# Patient Record
Sex: Male | Born: 1940 | Race: White | Hispanic: No | Smoking: Current every day smoker
Health system: Southern US, Community
[De-identification: ages and names within clinical notes are randomized; demographics above are authoritative.]

## PROBLEM LIST (undated history)

## (undated) DIAGNOSIS — M1990 Unspecified osteoarthritis, unspecified site: Secondary | ICD-10-CM

## (undated) DIAGNOSIS — E785 Hyperlipidemia, unspecified: Secondary | ICD-10-CM

## (undated) DIAGNOSIS — F329 Major depressive disorder, single episode, unspecified: Secondary | ICD-10-CM

## (undated) DIAGNOSIS — M62830 Muscle spasm of back: Secondary | ICD-10-CM

## (undated) DIAGNOSIS — M5417 Radiculopathy, lumbosacral region: Secondary | ICD-10-CM

## (undated) DIAGNOSIS — F32A Depression, unspecified: Secondary | ICD-10-CM

## (undated) DIAGNOSIS — M5136 Other intervertebral disc degeneration, lumbar region: Secondary | ICD-10-CM

## (undated) DIAGNOSIS — M47816 Spondylosis without myelopathy or radiculopathy, lumbar region: Secondary | ICD-10-CM

## (undated) DIAGNOSIS — I10 Essential (primary) hypertension: Secondary | ICD-10-CM

## (undated) DIAGNOSIS — I803 Phlebitis and thrombophlebitis of lower extremities, unspecified: Secondary | ICD-10-CM

## (undated) DIAGNOSIS — E559 Vitamin D deficiency, unspecified: Secondary | ICD-10-CM

## (undated) DIAGNOSIS — F419 Anxiety disorder, unspecified: Secondary | ICD-10-CM

## (undated) DIAGNOSIS — I2510 Atherosclerotic heart disease of native coronary artery without angina pectoris: Secondary | ICD-10-CM

## (undated) DIAGNOSIS — I219 Acute myocardial infarction, unspecified: Secondary | ICD-10-CM

## (undated) HISTORY — PX: TONSILLECTOMY: SUR1361

## (undated) HISTORY — PX: ESOPHAGOGASTRODUODENOSCOPY: SHX1529

## (undated) HISTORY — PX: CYST EXCISION: SHX5701

---

## 2011-06-28 ENCOUNTER — Observation Stay: Payer: Self-pay | Admitting: Surgery

## 2011-06-28 LAB — CBC WITH DIFFERENTIAL/PLATELET
Basophil #: 0 10*3/uL (ref 0.0–0.1)
Basophil %: 0.3 %
Eosinophil #: 0.6 10*3/uL (ref 0.0–0.7)
Eosinophil %: 5 %
HCT: 37.1 % — ABNORMAL LOW (ref 40.0–52.0)
HGB: 12.4 g/dL — ABNORMAL LOW (ref 13.0–18.0)
Lymphocyte #: 1.6 10*3/uL (ref 1.0–3.6)
MCH: 30.7 pg (ref 26.0–34.0)
MCHC: 33.3 g/dL (ref 32.0–36.0)
Monocyte %: 7.9 %
Neutrophil #: 8.3 10*3/uL — ABNORMAL HIGH (ref 1.4–6.5)
Platelet: 244 10*3/uL (ref 150–440)
RDW: 13.7 % (ref 11.5–14.5)
WBC: 11.4 10*3/uL — ABNORMAL HIGH (ref 3.8–10.6)

## 2011-06-28 LAB — BASIC METABOLIC PANEL
Anion Gap: 9 (ref 7–16)
BUN: 28 mg/dL — ABNORMAL HIGH (ref 7–18)
Co2: 25 mmol/L (ref 21–32)
Creatinine: 1.25 mg/dL (ref 0.60–1.30)
Glucose: 122 mg/dL — ABNORMAL HIGH (ref 65–99)
Osmolality: 288 (ref 275–301)
Potassium: 4 mmol/L (ref 3.5–5.1)

## 2011-06-28 LAB — PROTIME-INR: INR: 0.8

## 2011-06-28 LAB — APTT: Activated PTT: 33.5 secs (ref 23.6–35.9)

## 2011-07-01 LAB — PATHOLOGY REPORT

## 2011-07-09 LAB — WOUND CULTURE

## 2012-03-08 DIAGNOSIS — I219 Acute myocardial infarction, unspecified: Secondary | ICD-10-CM

## 2012-03-08 HISTORY — PX: CORONARY ARTERY BYPASS GRAFT: SHX141

## 2012-03-08 HISTORY — DX: Acute myocardial infarction, unspecified: I21.9

## 2012-09-25 ENCOUNTER — Inpatient Hospital Stay: Payer: Self-pay

## 2012-09-25 LAB — TROPONIN I
Troponin-I: 0.7 ng/mL — ABNORMAL HIGH
Troponin-I: 5.7 ng/mL — ABNORMAL HIGH
Troponin-I: 9.5 ng/mL — ABNORMAL HIGH
Troponin-I: 17 ng/mL — ABNORMAL HIGH

## 2012-09-25 LAB — CBC WITH DIFFERENTIAL/PLATELET
Eosinophil #: 0.2 10*3/uL (ref 0.0–0.7)
HCT: 23.8 % — ABNORMAL LOW (ref 40.0–52.0)
Lymphocyte #: 2.1 10*3/uL (ref 1.0–3.6)
Lymphocyte %: 24.3 %
MCHC: 34.5 g/dL (ref 32.0–36.0)
Monocyte %: 9.2 %
Neutrophil #: 5.6 10*3/uL (ref 1.4–6.5)
RBC: 2.61 10*6/uL — ABNORMAL LOW (ref 4.40–5.90)
RDW: 14.5 % (ref 11.5–14.5)

## 2012-09-25 LAB — CK TOTAL AND CKMB (NOT AT ARMC)
CK, Total: 125 U/L (ref 35–232)
CK, Total: 565 U/L — ABNORMAL HIGH (ref 35–232)
CK-MB: 12.2 ng/mL — ABNORMAL HIGH (ref 0.5–3.6)
CK-MB: 88.3 ng/mL — ABNORMAL HIGH (ref 0.5–3.6)

## 2012-09-25 LAB — CBC
HGB: 7.4 g/dL — ABNORMAL LOW (ref 13.0–18.0)
MCHC: 35.3 g/dL (ref 32.0–36.0)
MCV: 92 fL (ref 80–100)
Platelet: 293 10*3/uL (ref 150–440)
RBC: 2.28 10*6/uL — ABNORMAL LOW (ref 4.40–5.90)
RDW: 14.7 % — ABNORMAL HIGH (ref 11.5–14.5)
WBC: 10.7 10*3/uL — ABNORMAL HIGH (ref 3.8–10.6)

## 2012-09-25 LAB — BASIC METABOLIC PANEL
Anion Gap: 8 (ref 7–16)
BUN: 49 mg/dL — ABNORMAL HIGH (ref 7–18)
Calcium, Total: 9.5 mg/dL (ref 8.5–10.1)
Chloride: 107 mmol/L (ref 98–107)
Glucose: 118 mg/dL — ABNORMAL HIGH (ref 65–99)
Potassium: 3.8 mmol/L (ref 3.5–5.1)
Sodium: 137 mmol/L (ref 136–145)

## 2012-09-25 LAB — HEMOGLOBIN: HGB: 9.6 g/dL — ABNORMAL LOW (ref 13.0–18.0)

## 2012-09-26 LAB — CBC WITH DIFFERENTIAL/PLATELET
Basophil %: 0.3 %
Eosinophil #: 0.2 10*3/uL (ref 0.0–0.7)
Eosinophil %: 2.4 %
Lymphocyte #: 1.6 10*3/uL (ref 1.0–3.6)
Lymphocyte %: 20.8 %
MCH: 31.9 pg (ref 26.0–34.0)
MCV: 90 fL (ref 80–100)
Monocyte #: 0.6 x10 3/mm (ref 0.2–1.0)
Monocyte %: 7.7 %
Neutrophil #: 5.3 10*3/uL (ref 1.4–6.5)
RBC: 3.15 10*6/uL — ABNORMAL LOW (ref 4.40–5.90)
RDW: 14.8 % — ABNORMAL HIGH (ref 11.5–14.5)
WBC: 7.7 10*3/uL (ref 3.8–10.6)

## 2012-09-26 LAB — BASIC METABOLIC PANEL
Calcium, Total: 8.1 mg/dL — ABNORMAL LOW (ref 8.5–10.1)
Chloride: 110 mmol/L — ABNORMAL HIGH (ref 98–107)
Co2: 25 mmol/L (ref 21–32)
Creatinine: 1.35 mg/dL — ABNORMAL HIGH (ref 0.60–1.30)
Glucose: 91 mg/dL (ref 65–99)
Osmolality: 283 (ref 275–301)
Sodium: 139 mmol/L (ref 136–145)

## 2012-09-26 LAB — PROTIME-INR
INR: 1
Prothrombin Time: 13.6 secs (ref 11.5–14.7)

## 2012-09-26 LAB — HEMOGLOBIN: HGB: 9.8 g/dL — ABNORMAL LOW (ref 13.0–18.0)

## 2012-10-02 DIAGNOSIS — Z951 Presence of aortocoronary bypass graft: Secondary | ICD-10-CM

## 2012-10-19 ENCOUNTER — Inpatient Hospital Stay: Payer: Self-pay | Admitting: Internal Medicine

## 2012-10-19 LAB — CBC
HCT: 37.7 % — ABNORMAL LOW (ref 40.0–52.0)
HGB: 13.1 g/dL (ref 13.0–18.0)
MCH: 30.6 pg (ref 26.0–34.0)
MCV: 88 fL (ref 80–100)
RBC: 4.28 10*6/uL — ABNORMAL LOW (ref 4.40–5.90)
RDW: 16.1 % — ABNORMAL HIGH (ref 11.5–14.5)

## 2012-10-19 LAB — DRUG SCREEN, URINE
Barbiturates, Ur Screen: NEGATIVE (ref ?–200)
Cocaine Metabolite,Ur ~~LOC~~: NEGATIVE (ref ?–300)
MDMA (Ecstasy)Ur Screen: POSITIVE (ref ?–500)
Phencyclidine (PCP) Ur S: NEGATIVE (ref ?–25)
Tricyclic, Ur Screen: NEGATIVE (ref ?–1000)

## 2012-10-19 LAB — ETHANOL
Ethanol %: <0.003 % (ref 0.000–0.080)
Ethanol: <3 mg/dL

## 2012-10-19 LAB — URINALYSIS, COMPLETE
Bilirubin,UR: NEGATIVE
Glucose,UR: NEGATIVE mg/dL (ref 0–75)
Hyaline Cast: 23
Ketone: NEGATIVE
Leukocyte Esterase: NEGATIVE
Nitrite: NEGATIVE
Protein: NEGATIVE
RBC,UR: 1 /HPF (ref 0–5)
Specific Gravity: 1.01 (ref 1.003–1.030)
WBC UR: 1 /HPF (ref 0–5)

## 2012-10-19 LAB — COMPREHENSIVE METABOLIC PANEL
Alkaline Phosphatase: 137 U/L — ABNORMAL HIGH (ref 50–136)
Anion Gap: 10 (ref 7–16)
BUN: 55 mg/dL — ABNORMAL HIGH (ref 7–18)
Bilirubin,Total: 0.4 mg/dL (ref 0.2–1.0)
Calcium, Total: 9.5 mg/dL (ref 8.5–10.1)
Chloride: 101 mmol/L (ref 98–107)
Co2: 20 mmol/L — ABNORMAL LOW (ref 21–32)
EGFR (Non-African Amer.): 18 — ABNORMAL LOW
Potassium: 5 mmol/L (ref 3.5–5.1)
SGOT(AST): 35 U/L (ref 15–37)
SGPT (ALT): 67 U/L (ref 12–78)
Total Protein: 8.1 g/dL (ref 6.4–8.2)

## 2012-10-19 LAB — ACETAMINOPHEN LEVEL: Acetaminophen: <2 ug/mL

## 2012-10-20 LAB — BASIC METABOLIC PANEL
Anion Gap: 7 (ref 7–16)
Calcium, Total: 8.6 mg/dL (ref 8.5–10.1)
Chloride: 105 mmol/L (ref 98–107)
Co2: 22 mmol/L (ref 21–32)
Creatinine: 2.51 mg/dL — ABNORMAL HIGH (ref 0.60–1.30)
EGFR (African American): 28 — ABNORMAL LOW

## 2012-10-20 LAB — CBC WITH DIFFERENTIAL/PLATELET
Basophil #: 0.1 10*3/uL (ref 0.0–0.1)
HCT: 34.1 % — ABNORMAL LOW (ref 40.0–52.0)
HGB: 11.9 g/dL — ABNORMAL LOW (ref 13.0–18.0)
Lymphocyte #: 2.2 10*3/uL (ref 1.0–3.6)
Lymphocyte %: 26.1 %
MCH: 30.8 pg (ref 26.0–34.0)
Monocyte #: 0.9 x10 3/mm (ref 0.2–1.0)
Monocyte %: 10.8 %
Neutrophil #: 4.6 10*3/uL (ref 1.4–6.5)
Neutrophil %: 54.6 %
Platelet: 402 10*3/uL (ref 150–440)
RDW: 15.8 % — ABNORMAL HIGH (ref 11.5–14.5)
WBC: 8.4 10*3/uL (ref 3.8–10.6)

## 2012-10-20 LAB — MAGNESIUM: Magnesium: 2.3 mg/dL

## 2012-10-21 LAB — BASIC METABOLIC PANEL
Anion Gap: 7 (ref 7–16)
BUN: 42 mg/dL — ABNORMAL HIGH (ref 7–18)
Calcium, Total: 8.5 mg/dL (ref 8.5–10.1)
Chloride: 109 mmol/L — ABNORMAL HIGH (ref 98–107)
Co2: 22 mmol/L (ref 21–32)
Creatinine: 2.06 mg/dL — ABNORMAL HIGH (ref 0.60–1.30)
EGFR (African American): 36 — ABNORMAL LOW
EGFR (Non-African Amer.): 31 — ABNORMAL LOW
Glucose: 109 mg/dL — ABNORMAL HIGH (ref 65–99)
Osmolality: 287 (ref 275–301)
Potassium: 4.5 mmol/L (ref 3.5–5.1)
Sodium: 138 mmol/L (ref 136–145)

## 2012-10-22 LAB — BASIC METABOLIC PANEL
Anion Gap: 6 — ABNORMAL LOW (ref 7–16)
Calcium, Total: 8.3 mg/dL — ABNORMAL LOW (ref 8.5–10.1)
Co2: 22 mmol/L (ref 21–32)
EGFR (African American): 52 — ABNORMAL LOW
EGFR (Non-African Amer.): 45 — ABNORMAL LOW
Glucose: 95 mg/dL (ref 65–99)
Osmolality: 288 (ref 275–301)
Potassium: 4.3 mmol/L (ref 3.5–5.1)
Sodium: 141 mmol/L (ref 136–145)

## 2013-01-05 ENCOUNTER — Ambulatory Visit: Payer: Self-pay | Admitting: Podiatry

## 2013-03-19 ENCOUNTER — Emergency Department: Payer: Self-pay | Admitting: Emergency Medicine

## 2013-03-19 LAB — URINALYSIS, COMPLETE
BACTERIA: NONE SEEN
Bilirubin,UR: NEGATIVE
Blood: NEGATIVE
GLUCOSE, UR: NEGATIVE mg/dL (ref 0–75)
Ketone: NEGATIVE
Leukocyte Esterase: NEGATIVE
NITRITE: NEGATIVE
PH: 5 (ref 4.5–8.0)
Protein: NEGATIVE
RBC,UR: 1 /HPF (ref 0–5)
SPECIFIC GRAVITY: 1.012 (ref 1.003–1.030)
SQUAMOUS EPITHELIAL: NONE SEEN
WBC UR: <1 /HPF (ref 0–5)

## 2013-05-07 ENCOUNTER — Emergency Department: Payer: Self-pay | Admitting: Emergency Medicine

## 2013-05-07 LAB — COMPREHENSIVE METABOLIC PANEL
ALK PHOS: 106 U/L
AST: 16 U/L (ref 15–37)
Albumin: 3.6 g/dL (ref 3.4–5.0)
Anion Gap: 5 — ABNORMAL LOW (ref 7–16)
BILIRUBIN TOTAL: 0.2 mg/dL (ref 0.2–1.0)
BUN: 28 mg/dL — ABNORMAL HIGH (ref 7–18)
CALCIUM: 9.5 mg/dL (ref 8.5–10.1)
CO2: 24 mmol/L (ref 21–32)
CREATININE: 1.04 mg/dL (ref 0.60–1.30)
Chloride: 111 mmol/L — ABNORMAL HIGH (ref 98–107)
EGFR (Non-African Amer.): >60
GLUCOSE: 116 mg/dL — AB (ref 65–99)
Osmolality: 286 (ref 275–301)
Potassium: 4.1 mmol/L (ref 3.5–5.1)
SGPT (ALT): 30 U/L (ref 12–78)
Sodium: 140 mmol/L (ref 136–145)
TOTAL PROTEIN: 6.9 g/dL (ref 6.4–8.2)

## 2013-05-07 LAB — CBC WITH DIFFERENTIAL/PLATELET
Basophil #: 0 10*3/uL (ref 0.0–0.1)
Basophil %: 0.5 %
Eosinophil #: 0.3 10*3/uL (ref 0.0–0.7)
Eosinophil %: 4 %
HCT: 39.9 % — ABNORMAL LOW (ref 40.0–52.0)
HGB: 13 g/dL (ref 13.0–18.0)
LYMPHS ABS: 1.8 10*3/uL (ref 1.0–3.6)
LYMPHS PCT: 24.1 %
MCH: 30.7 pg (ref 26.0–34.0)
MCHC: 32.7 g/dL (ref 32.0–36.0)
MCV: 94 fL (ref 80–100)
MONO ABS: 0.7 x10 3/mm (ref 0.2–1.0)
MONOS PCT: 9.3 %
NEUTROS PCT: 62.1 %
Neutrophil #: 4.6 10*3/uL (ref 1.4–6.5)
PLATELETS: 218 10*3/uL (ref 150–440)
RBC: 4.25 10*6/uL — ABNORMAL LOW (ref 4.40–5.90)
RDW: 14.1 % (ref 11.5–14.5)
WBC: 7.4 10*3/uL (ref 3.8–10.6)

## 2013-12-30 IMAGING — CR DG CHEST 2V
1 series · 2 of 2 positions shown · non-contrast
Comparison: none

REASON FOR EXAM: depression - recent cardiac bypass
COMMENTS:

[Series 1: w chest pa · 0.14mm/px · 2 of 2 slices shown]
[im 1/2]
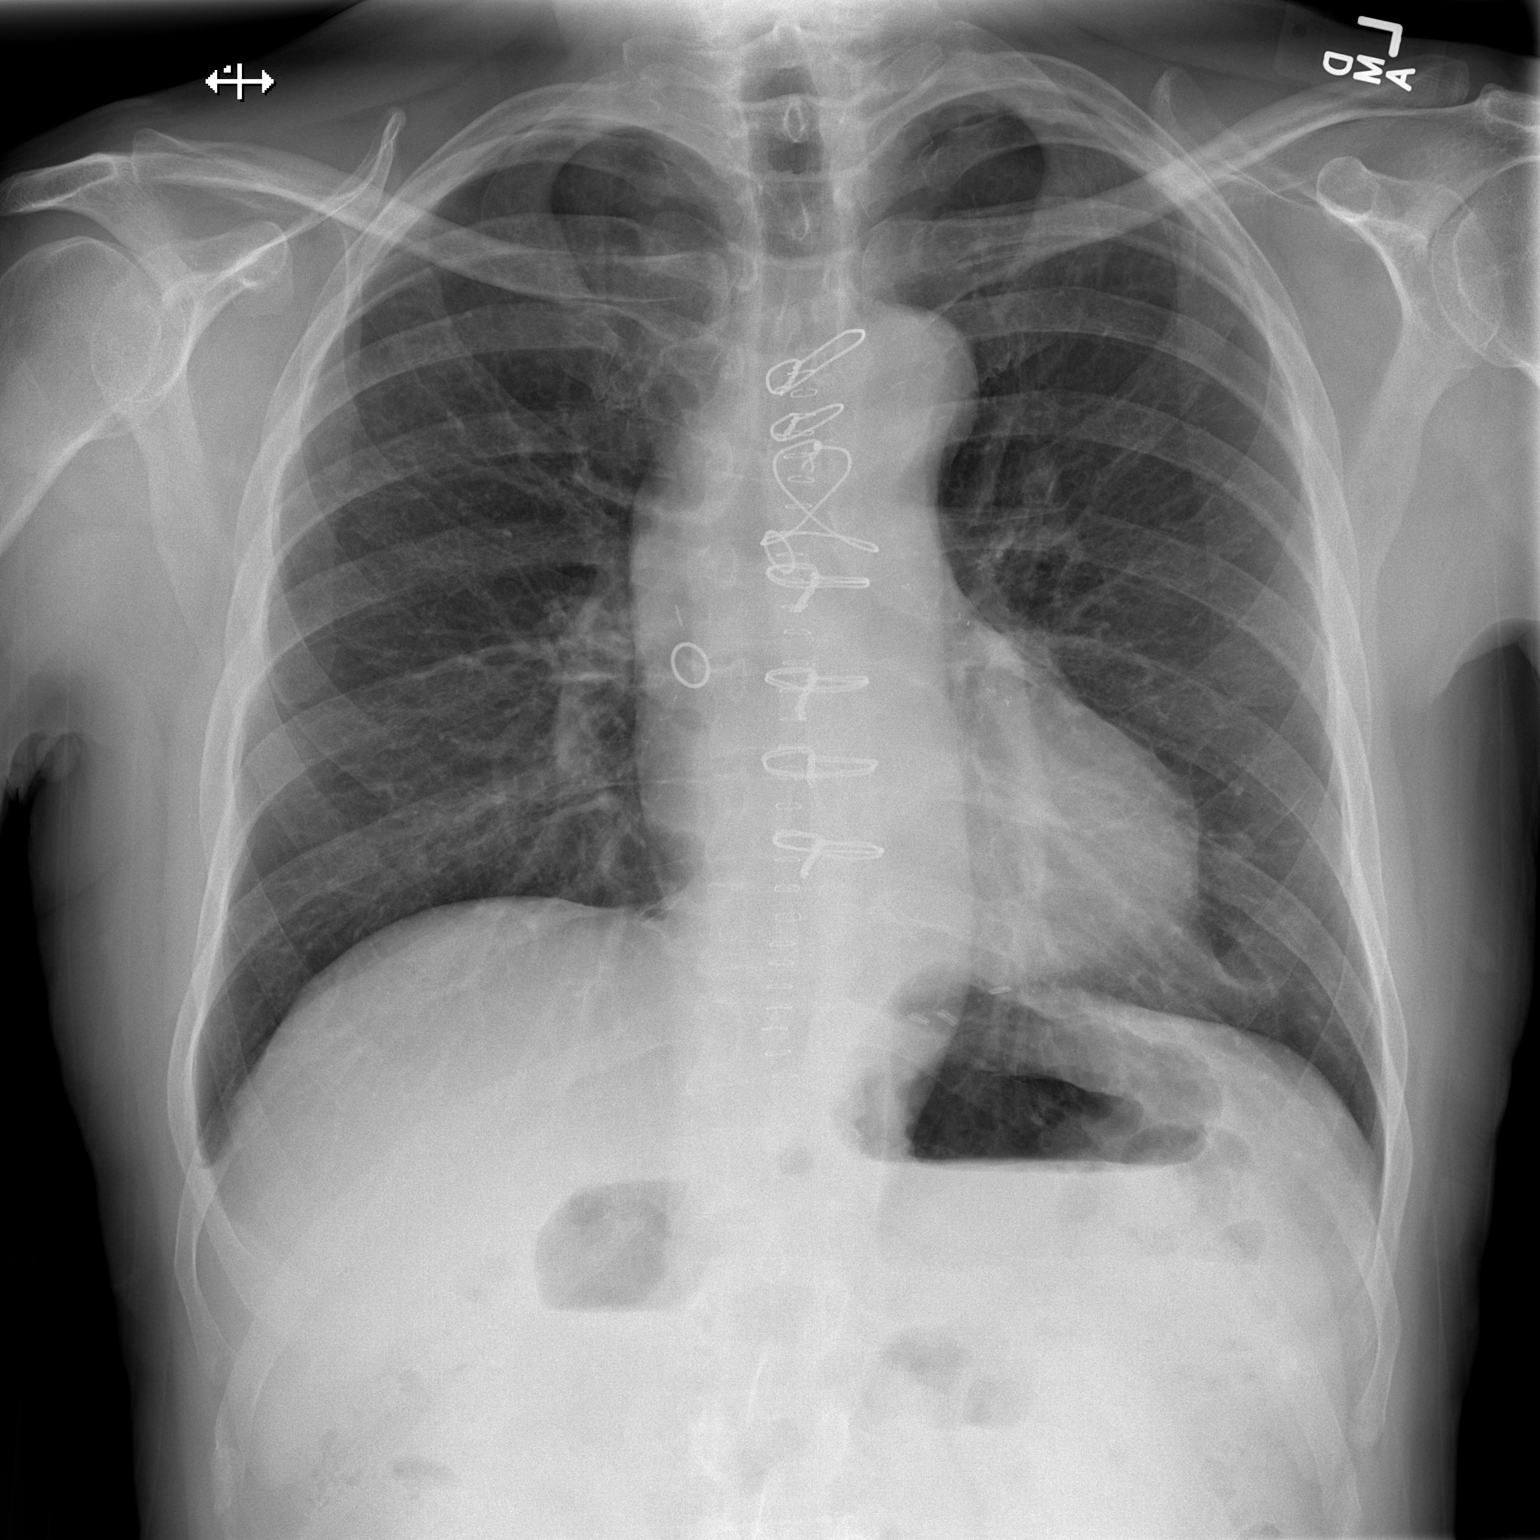
[im 2/2]
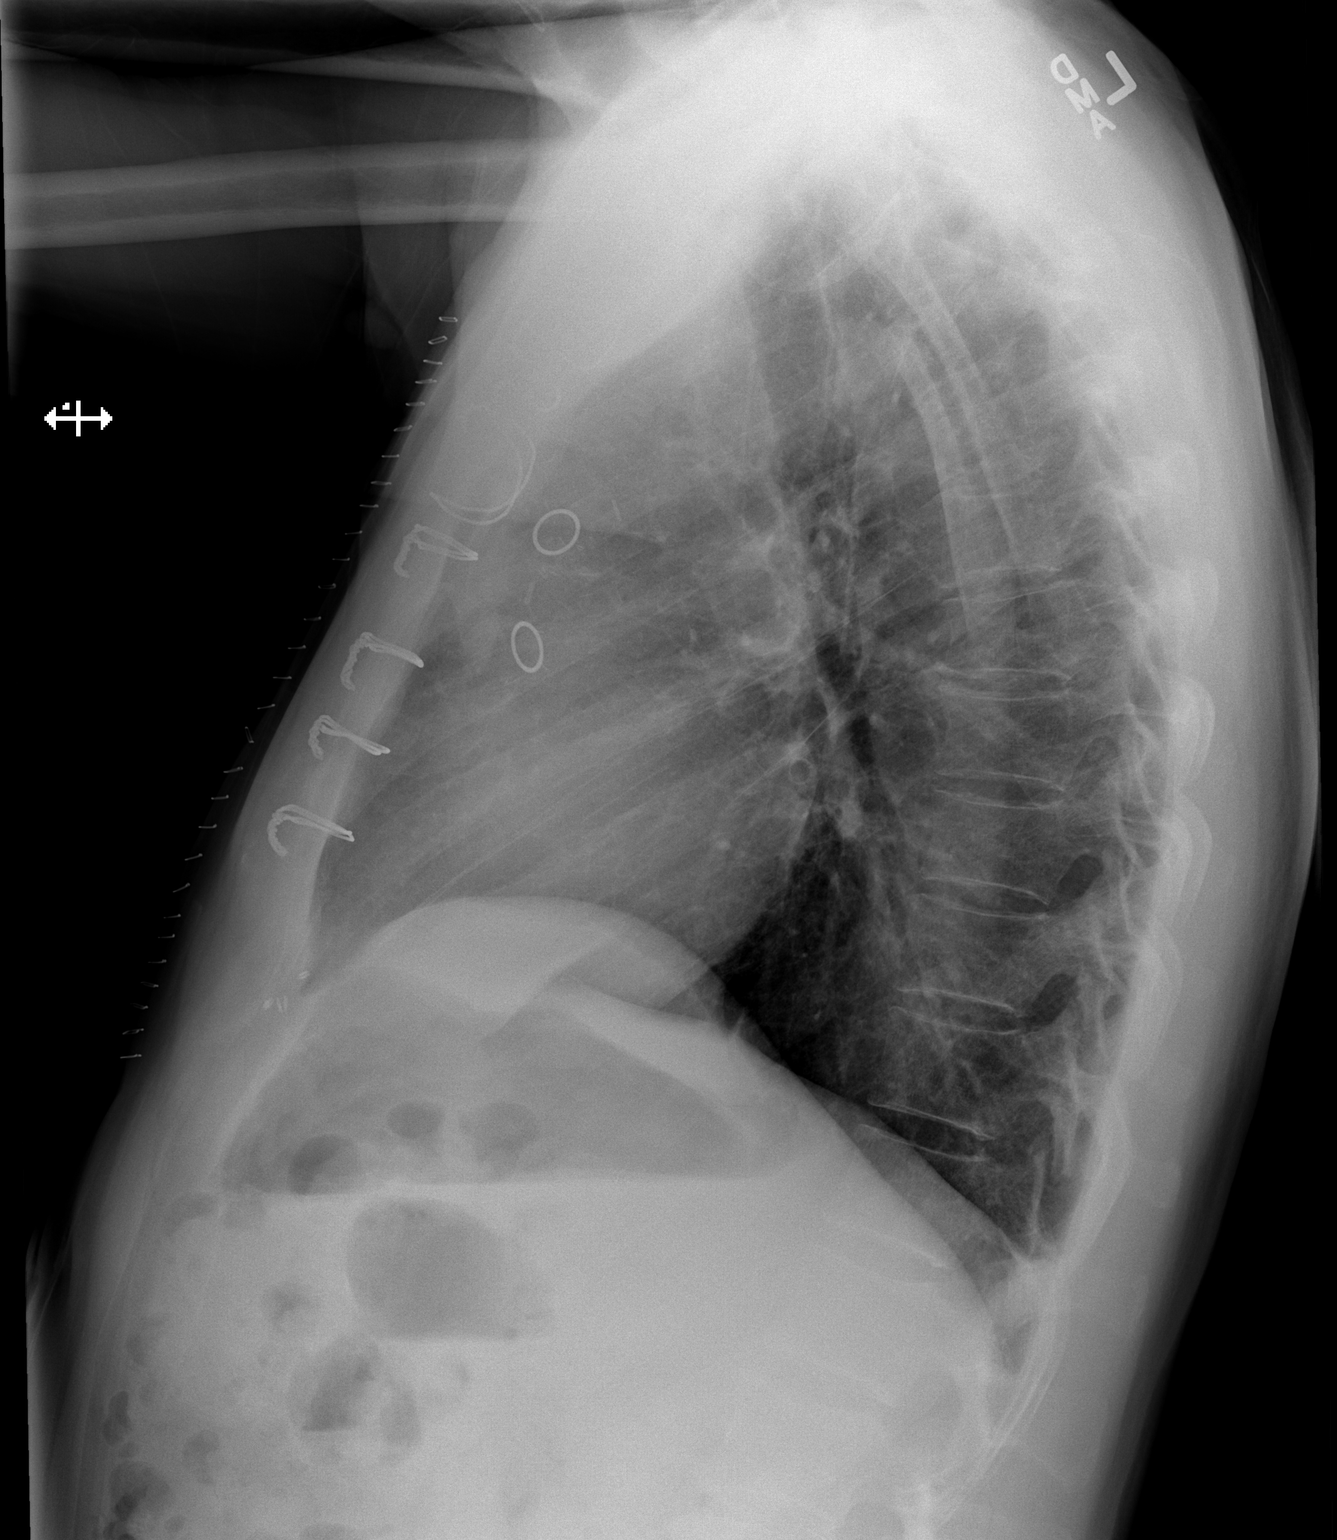

[2 of 2 positions shown; findings below may reference images not displayed]

PROCEDURE:     DXR - DXR CHEST PA (OR AP) AND LATERAL  - October 19, 2012  [DATE]

RESULT:     Comparison is made to the study September 25, 2012.

The lungs are well-expanded and clear. There is no infiltrate nor
atelectasis. The cardiac silhouette is normal in size. The pulmonary
vascularity is not engorged. The patient has undergone previous median
sternotomy and CABG. There is no pleural effusion.
IMPRESSION: There is no evidence of acute cardiopulmonary abnormality.

[REDACTED]

## 2013-12-31 IMAGING — US US RENAL KIDNEY
1 series · 14 of 25 positions shown · non-contrast
Comparison: none

REASON FOR EXAM: arf
COMMENTS:

[Series 1: us renal kidney · 0.28mm/px · 14 of 31 slices shown]
[im 1/31]
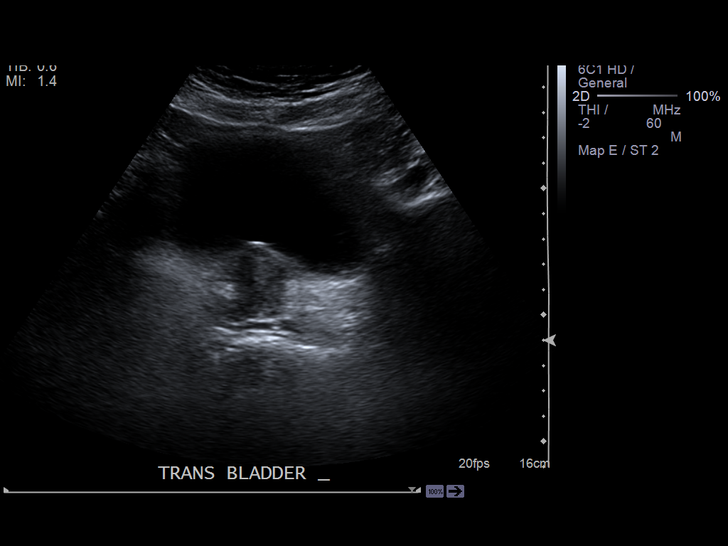
[im 3/31]
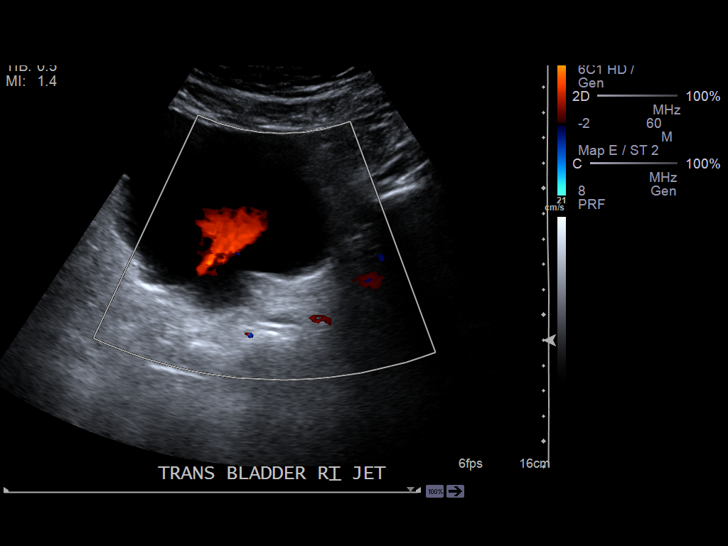
[im 6/31]
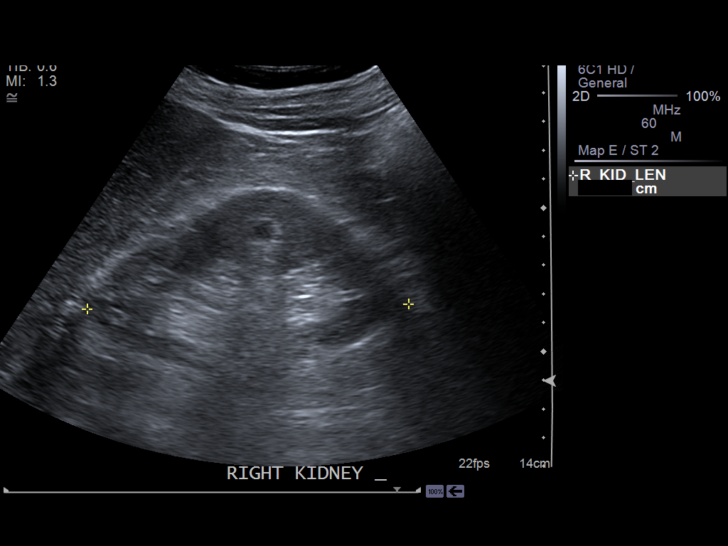
[im 8/31]
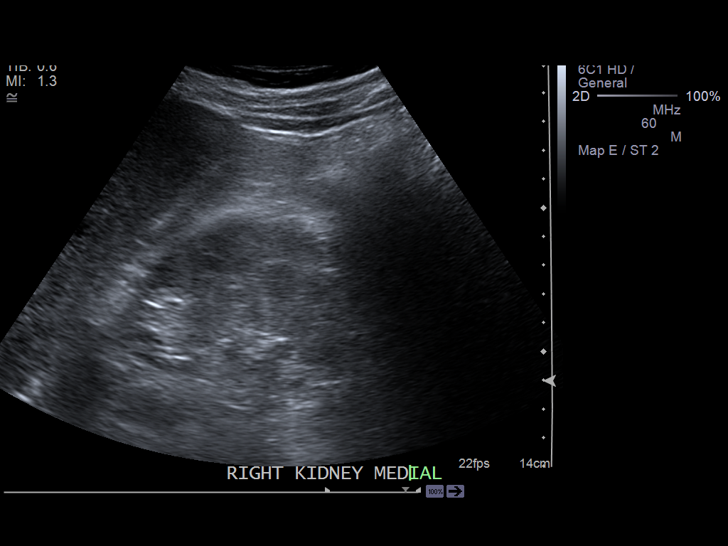
[im 11/31]
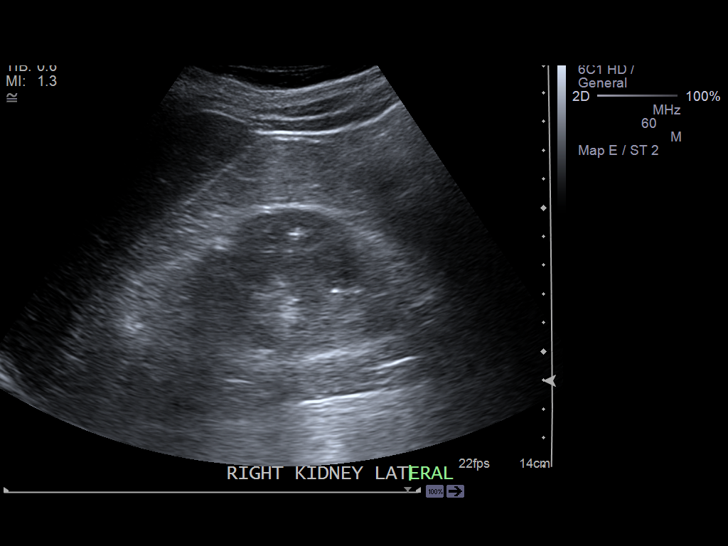
[im 12/31]
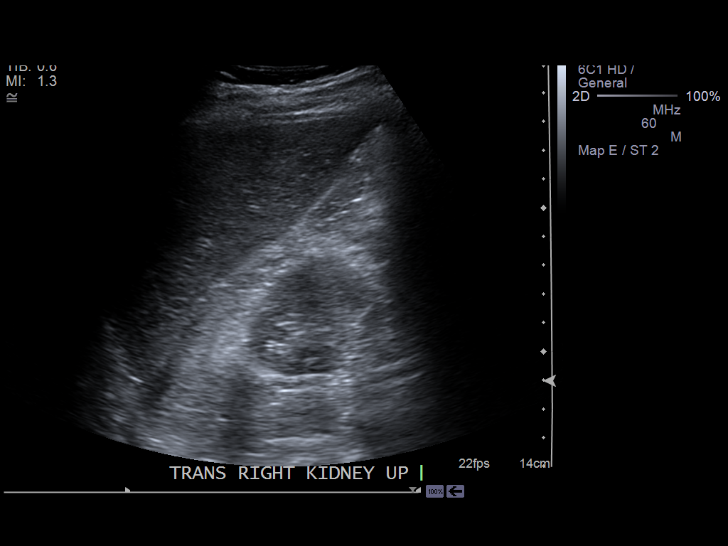
[im 14/31]
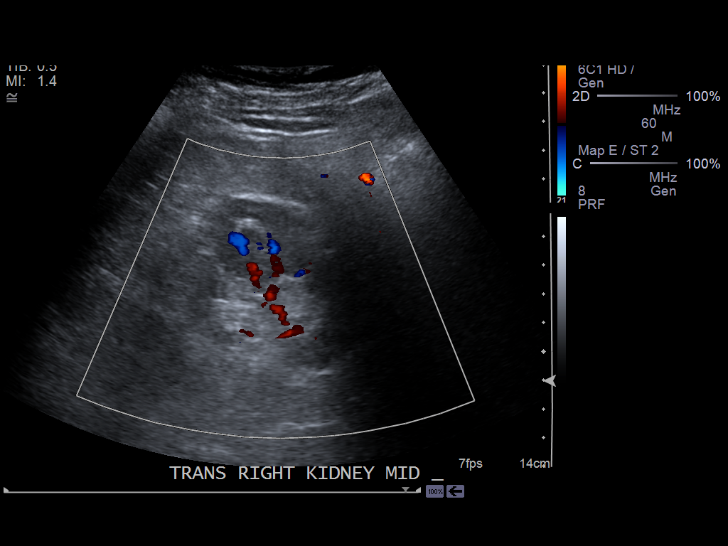
[im 17/31]
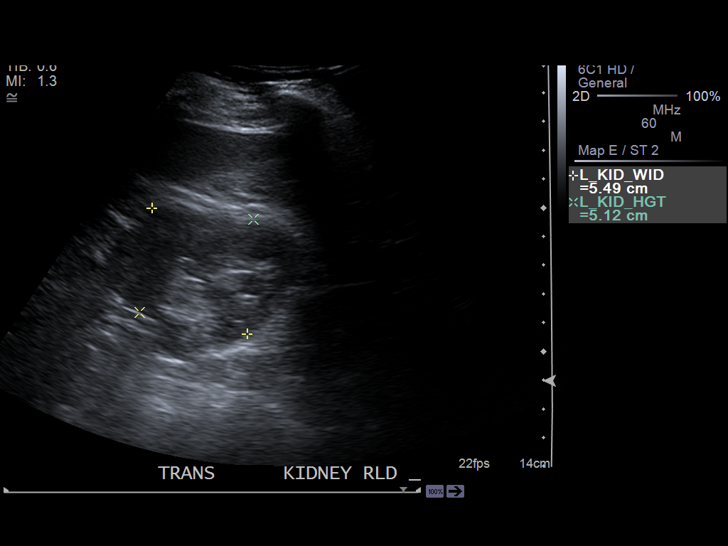
[im 19/31]
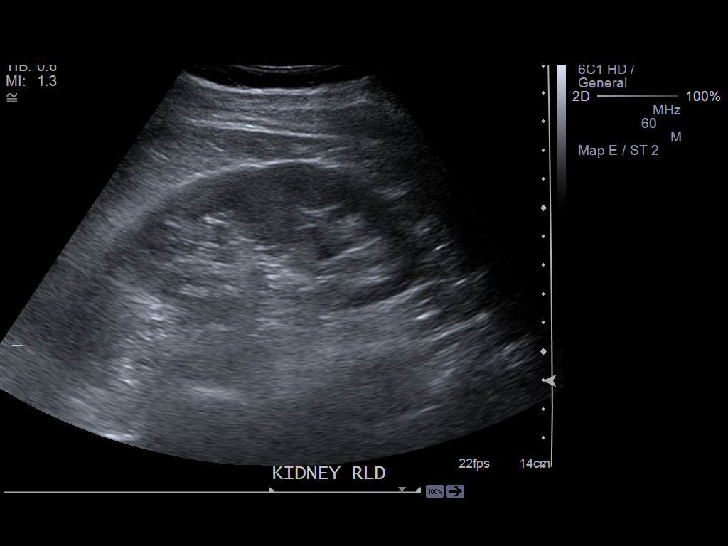
[im 21/31]
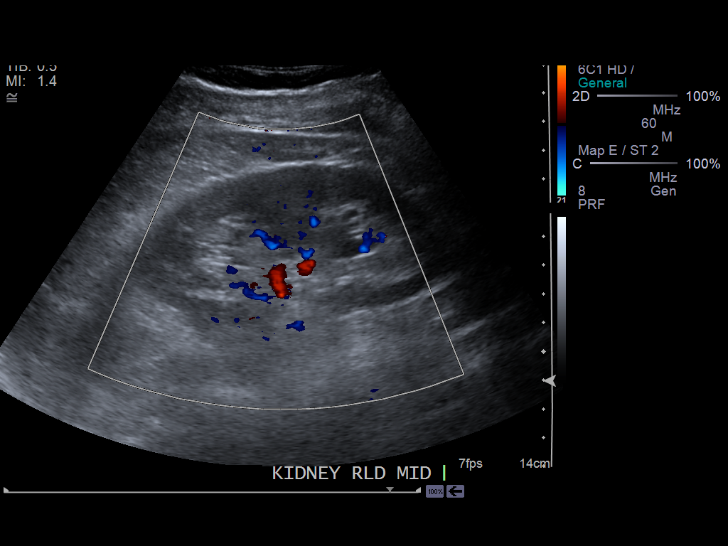
[im 23/31]
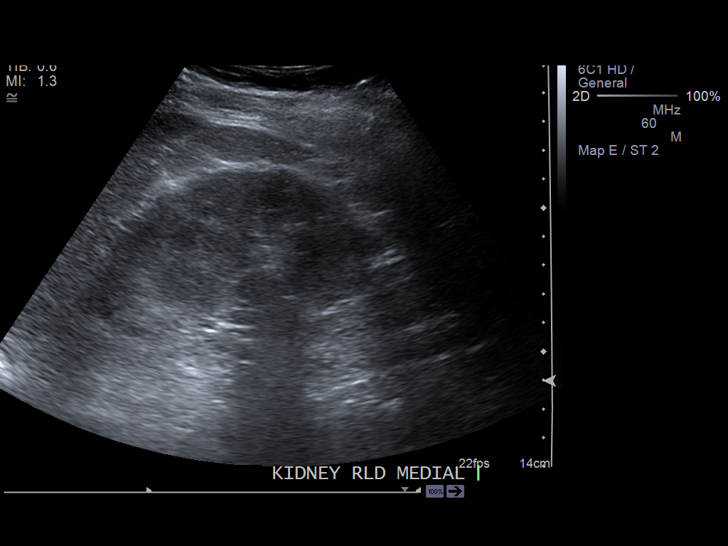
[im 26/31]
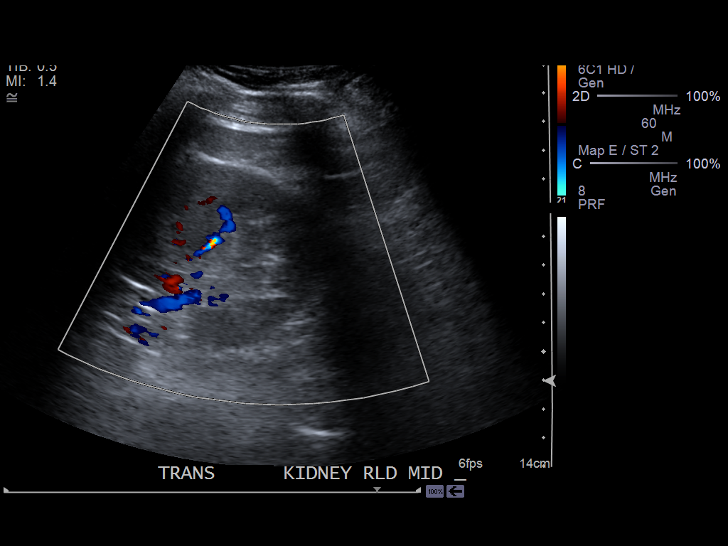
[im 28/31]
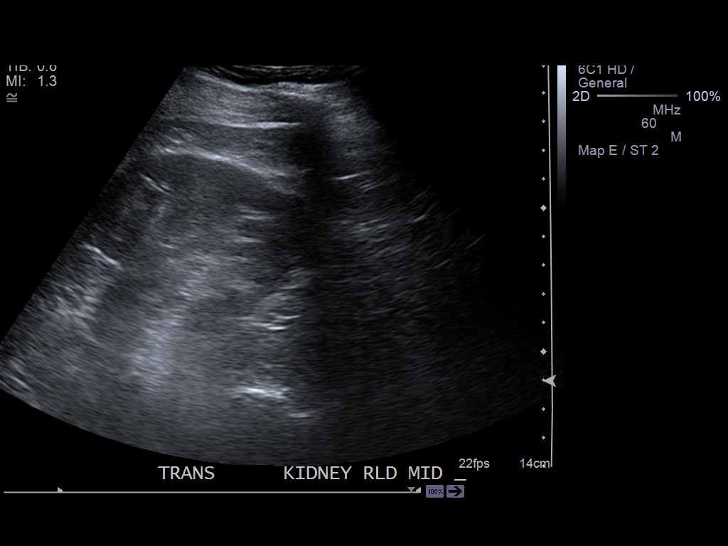
[im 31/31]
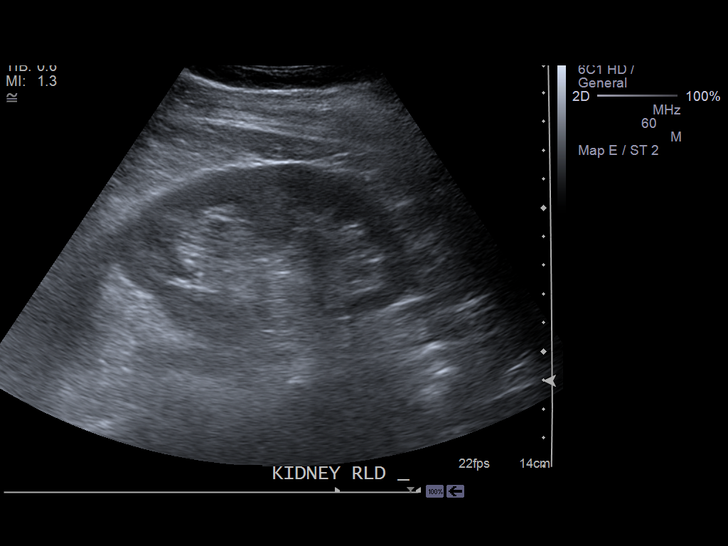

[14 of 25 positions shown; findings below may reference images not displayed]

PROCEDURE:     US  - US KIDNEY  - October 20, 2012 [DATE]

RESULT:     Comparison: None

Technique and findings: Multiple gray-scale and color doppler images of the
kidneys were obtained.

The right kidney measures 11.2 x 4.8 x 4 cm and the left kidney measures
11.5 x 5.5 x 5.1 cm. The kidneys are normal in echogenicity. There is no
hydronephrosis.  There are no echogenic foci.  There are no renal masses.
There is no free fluid in the region of the renal fossa. Bilateral ureteral
jets are noted.
IMPRESSION: Normal renal ultrasound.

[REDACTED]

## 2014-01-03 ENCOUNTER — Ambulatory Visit: Payer: Self-pay | Admitting: Physical Medicine and Rehabilitation

## 2014-01-17 ENCOUNTER — Encounter: Payer: Self-pay | Admitting: Physical Medicine and Rehabilitation

## 2014-02-05 ENCOUNTER — Encounter: Payer: Self-pay | Admitting: Physical Medicine and Rehabilitation

## 2014-06-28 NOTE — Consult Note (Signed)
Brief Consult Note: Diagnosis: Major depressive disorder.   Patient was seen by consultant.   Consult note dictated.   Recommend further assessment or treatment.   Orders entered.   Comments: [name] [name] has a h/o drepression. His medications were discontinued after CABG. He became depressed and passively suicidal.  PLAN: 1. The oatient is not suicidal or homicidal. Please discharge as appropriate.  2. Will restart all medications as prescribed by Dr. Bridgett Larsson Wellbutrin 75 mg bid, Remeron 60 mg at night, BuSpar 15 mg bid and Valium 5 mg as needed..  Electronic Signatures: Orson Slick (MD)  (Signed 15-Aug-14 17:24)  Authored: Brief Consult Note   Last Updated: 15-Aug-14 17:24 by Orson Slick (MD)

## 2014-06-28 NOTE — Consult Note (Signed)
PATIENT NAME:  [name] [name], [name] [name] MR#:  [id number] DATE OF BIRTH:  1940/08/20  DATE OF CONSULTATION:  09/25/2012  REFERRING PHYSICIAN:  Phillips Climes, MD CONSULTING PHYSICIAN:  Gaylyn Cheers, MD / Janalyn Harder. Jerelene Redden, ANP (Adult Nurse Practitioner)  PRIMARY CARE PHYSICIAN: Adrian Prows, MD  REASON FOR CONSULTATION:  GI bleed.  HISTORY OF PRESENT ILLNESS: This 74 year old patient with medical history of hypertension, depression and anxiety, and recent left anterior chest shingles who  was admitted through the Emergency Room for acute chest pain that started last night. The patient reports that he has had fatigue over the last week or 2, and on 09/22/2012 he noticed an abrupt worsening of fatigue associated with DOE and profound weakness and dizziness. The patient felt slightly better on Saturday. He noticed black, tarry stools the day before and it continued through yesterday. Brown bowel movement yesterday. He has developed chest pain last night, epigastric, radiating to the left anterior chest and into the back. He was diagnosed with left anterior shingles earlier this month and has been on hydrocodone and Valtrex. This chest pain last night was different and associated with lightheadedness, weakness and shortness of breath at rest, but no nausea, vomiting or diarrhea. He called 911 after several hour delay.   In the ER, he was found to have a drop in hemoglobin from baseline of 14 down to 7.5 and a slight elevation in troponin. Cardiologist consult has been requested. The patient is going to be admitted to the ICU. GI has been asked to see the patient regarding melena and abrupt drop in hemoglobin.   The patient states he was diagnosed with an ulcer in the late 60s. He did have an upper endoscopy in the 90s and he does not recall the results or why he had it. The patient typically does not have heartburn, except for maybe 2 to 3 times a month, and he will take Tums. The patient has placed himself on  aspirin 325 twice daily for many years. He reports normal diet, appetite and weight. With new diagnosis of shingles and left epicondylitis, he has been taking Advil 1800 mg for about 3 weeks and increased the dose to 2400 mg in the last 2 weeks. The patient also has utilized hydrocodone and stools were just a little constipated during that time frame. Currently, he has epigastric pain, burning in the chest, and this can fluctuate with position changes. He feels very short of breath, weak, and if he takes more than a few steps he feels dizzy. He has had no bowel movement yet today. He declined digital rectal exam because of prior anal abscess surgery. No history of colonoscopy.    PAST MEDICAL HISTORY: 1.  Hypertension.  2.  Depression and anxiety. Has been followed by Dr. Bridgett Larsson.  3.  Low vitamin D.  4.  Bilateral epicondylitis.  5.  Left anterior chest shingles diagnosed 09/12/2012.  6.  Remote peptic ulcer disease, maybe in the 1960s, and the patient reports EGD in 1990s, results unknown.   PAST SURGICAL HISTORY:  1.  Tonsillectomy as a child.  2.  Left breast biopsy in 1968.  3.  Right perianal abscess, treated with surgery, 06/28/2011.   MEDICATIONS:  1.  Aspirin 325 mg 1 tablet twice daily; the patient placed himself on this years ago.  2.  Bupropion 75 mg once daily.  3.  Bupropion 15 mg 1 tablet twice daily.  4.  Lisinopril 30 mg once daily.  5.  Mirtazapine 30 mg twice  daily.  6.  Valium 5 mg twice daily.  7.  Advil 2400 mg daily for 2 weeks.  8.  Completed Valtrex. 9.  Completed Norco for shingles.   ALLERGIES: No known drug allergies.   HABITS: Positive tobacco, 2 packs per day, now down to 1-1/2 packs per day. Rare social alcohol use, a few times a year. Denies heavy history of alcohol use.   FAMILY HISTORY: Father deceased at 22 of throat cancer with history of alcohol abuse. Mother deceased at 44 of suicide. The patient has half-brothers and half-sisters and their history is  unknown. To his knowledge, negative for colon cancer, colon polyps.   SOCIAL HISTORY: The patient is divorced, has a 48 year old son. Retired Dance movement psychotherapist.   REVIEW OF SYSTEMS: Ten systems reviewed. Positive pertinents as noted in history of present illness; otherwise, negative.   LABORATORY AND DIAGNOSTICS: Admission blood work with glucose 118, BUN 49, creatinine 1.52. CPK total 125. CPK/MB 12.2. Troponin 0.70.  Hemoglobin 7.4, WBC 10.7, platelet count 293, MCV 92, MCH 32.4.   Chest x-ray, single view:  Normal.   PHYSICAL EXAMINATION: VITAL SIGNS: 97.8, 77, 16, blood pressure 124/67 to 118/56. Normal sinus rhythm. EKG reportedly negative.  GENERAL:  The patient is well-appearing, sitting up in bed, alert and interacting. No acute distress.  HEENT: Head is normocephalic. Conjunctivae pink. Sclerae anicteric. Oral mucosa is dry and intact. Brown discoloration on the tongue.  NECK: Supple. Trachea midline.  HEART: Heart tones S1, S2 without murmur or gallop.  LUNGS: CTA. Respirations are eupneic.  ABDOMEN: Soft. Bowel sounds present. Nontender in all quadrants.  RECTAL: Previously declined by patient.  LOWER EXTREMITIES: Without edema, cyanosis or clubbing.  SKIN: Warm and dry without rash or excessive bruising.  MUSCULOSKELETAL: Joints without swelling, inflammation. Gait not evaluated.  NEUROLOGIC: Cranial nerves II through XII grossly intact.  PSYCH: Affect and mood within normal.  He has very pleasant, joking, INAD.  IMPRESSION: The patient reports chronic aspirin use and chronic Advil use, especially over the last 3 weeks. He has had minimal heartburn through the years, but does report a remote stomach ulcer 45 years ago and an EGD in 1990s. The patient's current symptoms began with weakness last week that became acute on Friday with dizziness, lightheadedness and black tarry stools. He passed black stools until yesterday. Yesterday stools reported brown. The patient likely has  NSAID-induced ulcer. This could be gastric or duodenal. He declined digital rectal exam. Hemoglobin is hemodynamically significant with a drop from baseline, about 14 down to 7 range.  Troponin is slightly elevated. EKG reportedly unremarkable. Cardiology consult pending.   PLAN: The patient is being admitted from the ER to the CCU.  We will place him n.p.o. for consideration of EGD this afternoon. The patient states he would like to meet Dr. Vira Agar before consenting to the procedure. He is on a Protonix drip. The patient was advised against taking high dose aspirin and NSAIDs. He voices understanding. I did recommend eventual colonoscopy for which he says he will likely decline. Further GI recommendations pending endoscopy  findings. Thank you for the consultation.   These services are provided by Joelene Millin A. Jerelene Redden, RN, MS, APRN, [facility], ANP under collaborative agreement with Manya Silvas, MD  ____________________________ Janalyn Harder Jerelene Redden, ANP (Adult Nurse Practitioner) kam:sb D: 09/25/2012 08:57:18 ET T: 09/25/2012 09:36:36 ET JOB#: [id number]  cc: Joelene Millin A. Jerelene Redden, ANP (Adult Nurse Practitioner), <Dictator> Cheral Marker. Ola Spurr, MD Janalyn Harder. Sherlyn Hay, MSN, ANP-BC Adult Nurse Practitioner ELECTRONICALLY  SIGNED 09/25/2012 17:11

## 2014-06-28 NOTE — H&P (Signed)
PATIENT NAME:  [name] [name], [name] [name] MR#:  [id number] DATE OF BIRTH:  11-01-1940  DATE OF ADMISSION:  10/19/2012  PRIMARY CARE PHYSICIAN:  Dr. Ola Spurr.   PRIMARY CARDIOLOGIST:  Dr. Ubaldo Glassing.    REFERRING PHYSICIAN:  Dr. Thomasene Lot.   CHIEF COMPLAINT:  The patient was brought in for suicidal thoughts and depression.   HISTORY OF PRESENT ILLNESS:  The patient is a pleasant 74 year old Caucasian male with a history of a recent non-ST elevation MI and upper GI bleed who got transferred to [facility] on July 22nd where he had a long hospitalization and just got discharged on the 5th to [facility]. While at [facility], the patient underwent a 3-vessel CABG, it appears, and had postop complications including cardiogenic shock, acute on chronic renal failure, paroxysmal a-fib with rapid ventricular rate on amnio and beta blocker. He has been at the rehab facility, which the patient has multiple complaints of including him not liking the food, not getting enough food, and the patient just felt fed up per his describing of the situation and has had about 14 pounds of weight loss there in the last 9 days or so. He states that although he did not have any suicidal ideation or wanting to hurt himself, he felt life is not worth anything if he was to remain in there and then he was transferred here. Of note, he was supposed to be there until the 26th or so. He still has staples intact. He has had no fevers or chills. Here in the ER, he was noted to have renal failure with a creatinine of 3.2, which is significantly higher than his baseline, with potassium of 5. Hospitalist service was contacted for further evaluation and management. He still feels depressed and states he has been having crying spells. He denies having a suicidal ideation.   PAST MEDICAL HISTORY:  1.  CAD, status post 3-vessel CABG on July 28th in [place].  2.  Hypertension.  3.  Hyperlipidemia.  4.  Paroxysmal a-fib, now in sinus.  5.  Anxiety. 6.  Upper GI  bleed, now resolved.  7.  Chronic renal failure with discharge creatinine of 1.7 from [facility].  8.  A history of vitamin D deficiency. 9.  Depression.   ALLERGIES:  No known drug allergies.   SOCIAL HISTORY:  He was a smoker prior to his CABG, now he is on a patch. No alcohol or drug use. He used to live by himself and states he used to walk for a couple of miles daily.   FAMILY HISTORY:  Father had throat cancer and alcohol abuse. Mom had suicide.   OUTPATIENT MEDICATIONS:  Amiodarone 200 mg daily, aspirin 81 mg daily, atorvastatin 80 mg daily, bupropion 75 mg 2 times a day, buspirone 10 mg 2 times a day, diazepam 2.5 mg every 12 hours as needed for anxiety or sleep, ferrous sulfate 325 mg 2 times a day, Lasix 40 mg 2 times a day, Klor-Con 20 mEq 4 tabs once a day, metoprolol tartrate 12.5 mg every 12 hours, mirtazapine 15 mg at bedtime. This dose was decreased from his usual 60 mg at bedtime before surgery. Nicotine patch 21 mg daily one patch, oxycodone 5 mg 1 to 2 tabs every 6 hours as needed for pain, Promethazine 25 mg every 6 hours as needed for nausea, Senna 8.6 mg 2 tabs 2 times a day for constipation, Bactrim 800/160 dose.  REVIEW OF SYSTEMS:  CONSTITUTIONAL:  Positive for with a total weight loss of 20  pounds postop. No fevers or chills.  EYES:  No blurry vision or double vision. No glaucoma or cataracts.  EARS, NOSE, THROAT:  No tinnitus or hearing loss.  RESPIRATORY:  No cough, shortness of breath, wheezing, hemoptysis, dyspnea or painful respirations.  CARDIOVASCULAR:  No chest pain. No dyspnea on exertion. No heartburn. Has a history of postop paroxysmal a-fib. No palpitations or syncope.  GASTROINTESTINAL:  No nausea, vomiting, diarrhea, abdominal pain, black stools or and tarry stools. A recent upper GI bleed.  GENITOURINARY:  Denies dysuria or hematuria.  HEMATOLOGIC AND LYMPHATIC:  No anemia or easy bruising.  SKIN:  No rashes.  MUSCULOSKELETAL:  Denies arthritis or gout.   NEUROLOGIC:  Denies focal weakness or numbness. Has left upper shoulder area post-herpetic neuralgia.  PSYCHIATRIC:  Has chronic depression and more so recently.    PHYSICAL EXAMINATION:  VITAL SIGNS:  Temperature on arrival 98.3, pulse rate 74, respiratory rate 22, blood pressure 115/72, O2 sat was 96% on room air.  GENERAL:  The patient is a thin, tall, Caucasian male, sitting at the edge of the bed, talking in full sentences with normal affect, no obvious distress.  HEENT:  Normocephalic, atraumatic. Pupils are equal and reactive. Extraocular muscles intact. Dry mucous membranes.  NECK:  Supple. No thyroid tenderness. No cervical lymphadenopathy.  CARDIOVASCULAR:  S1, S2, regular rate and rhythm. No murmurs, rubs or gallops.  LUNGS:  Clear to auscultation without wheezing, rhonchi or rales. There is a large mid sternal healing wounds with staples intact without any significant drainage or evidence for cellulitis.  ABDOMEN:  Soft, nontender, nondistended. Positive bowel sounds in all quadrants.  There is 3 areas of suture placements with intact sutures in mid abdominal area. No surrounding cellulitis.  EXTREMITIES:  No significant lower extremity edema.  NEUROLOGICAL:  Cranial nerves II through XII grossly intact. Strength is 5/5 in all extremities. Sensation is intact to light touch.  PSYCHIATRIC:  Awake, alert, oriented, pleasant, cooperative.   LABS:  WBC 10.9, hemoglobin 13.1, platelets 528. UA not suggestive of infection. Serum acetaminophen salicylates not elevated. Urine drug screen positive for benzodiazepines and MDMA is positive. TSH is 5.01. LFTs showed alk phos of 137, otherwise within normal limits. BUN is 55, creatinine is 3.2, sodium 131, potassium is 5.   X-ray of the chest is ordered, not done yet.   EKG:  Normal sinus rhythm with sinus arrhythmia. There are Q-waves in V5, V6, and some T-wave inversions in those leads, and there are some ST elevation in lead 2.    ASSESSMENT AND PLAN:  We have a pleasant 74 year old Caucasian male who was transferred to [facility] on July 22nd for 3-vessel coronary artery disease requiring coronary artery bypass graft, but also upper GI bleed, who had a prolonged hospitalization at [facility], who comes in for worsening depression and acute on chronic renal failure secondary to poor p.o. intake and him not eating well, likely also being on some medications including Lasix and Bactrim, which could potentially worsen the kidney function. He has no chest pains, no palpitations and is doing well from cardiac standpoint, he states. At this point, we will admit him to the hospital service and start him on some gentle fluids, hold the Bactrim and the Lasix. He has no suicidal ideation per him and he states that he loves life and does not want to hurt himself and he did not want to undergo open heart surgery if he wanted to hurt himself now. We would, nonetheless, start the patient  on a higher dose of mirtazapine at this point as before he was on 60 mg and now on 15. We would obtain a Psych consult and place a sitter in the room. He does have mild hyponatremia, and this should hopefully get better with normal saline and this could be hypovolemic hyponatremia now. In regards to his recent upper gastrointestinal bleed, his hemoglobin has improved. We would continue the iron. We would continue the beta blocker and amiodarone for his paroxysmal atrial fibrillation. Of note, he is not on anticoagulation, likely in the setting of a recent gastrointestinal bleed. We would let Psychiatry deal with the other depression medications. We would hold the Klor-Con given the renal failure and continued the nicotine patch and his pain medications.   CODE STATUS:  THE PATIENT IS FULL CODE.   TOTAL TIME SPENT IS:  60 minutes.  ____________________________ Vivien Presto, MD sa:jm D: 10/19/2012 15:26:33 ET T: 10/19/2012 16:21:00 ET JOB#: [id number]  cc: Vivien Presto, MD, <Dictator> Cheral Marker. Ola Spurr, MD Javier Docker Ubaldo Glassing, MD Vivien Presto MD ELECTRONICALLY SIGNED 10/29/2012 12:37

## 2014-06-28 NOTE — Consult Note (Signed)
CC: MI, GI bleed, pt with hgb 9.8, troponin up to 17.  His cath was done, needs to be transfered to [facility].  I explained to him that he likely had bleeding ulcers that have stopped and now have clots on them which need to mature to decrease the chance of repeat bleeding and that takes about 72 hours which is why he is on a restricted diet for now.    Electronic Signatures: Manya Silvas (MD)  (Signed on 22-Jul-14 15:05)  Authored  Last Updated: 22-Jul-14 15:05 by Manya Silvas (MD)

## 2014-06-28 NOTE — Consult Note (Signed)
CC:: LGI bleeding with melena.  Likely from UGI source.  Last two stools more brown than black.  Lots of NSAID recently due to back pain and neuralgia from Shingles.  Pt was anemic on admission, developed elevated troponin over 5 and EKG changes c/w ischemic heart disease.  Cath planned today per CCU nurse.  Will hold EGD plans for now, scope only for life threatening bleeding. Continue bid PPI or drip, sips and chips for now, advance to no carbonation clear liquid after cath.  Will follow with you, monitor daily CBC for next few days.  Electronic Signatures: Manya Silvas (MD)  (Signed on 21-Jul-14 14:19)  Authored  Last Updated: 21-Jul-14 14:19 by Manya Silvas (MD)

## 2014-06-28 NOTE — H&P (Signed)
PATIENT NAME:  [name] [name] MR#:  [id number] DATE OF BIRTH:  [birth year]  DATE OF ADMISSION:  09/25/2012  REFERRING PHYSICIAN:  Dr. Conni Slipper.  PRIMARY CARE PHYSICIAN: Dr. Adrian Prows.    CHIEF COMPLAINT: Atypical chest pain.   HISTORY OF PRESENT ILLNESS: This is a 74 year old male with a significant past medical history of hypertension, hyperlipidemia, vitamin D deficiency, depression who presents with complaint of atypical chest pain. Reports he has chest pain that started his evening at first. Reports his pain was worsening when he was laying supine. Reports his feet feel like heartburn and indigestion going through his chest. Reports this has been in quality nonradiating. His other complaint was of some mild shortness of breath. As well, he reports he has some dizziness and lightheadedness, worsened by deep inspiration and exertion.   The patient refused rectal exam by ED physician. As well he refused a rectal exam by me so he was instructed to give [place] a stool sample at soon as he has a bowel movement to have Hemoccult done.  In the ED, the patient was found to have elevated troponin at 0.7. His EKG had some abnormalities in the anterolateral leads, but they were stable as compared to the old EKG at [facility] in 2012.   As well, the patient was found to have a hemoglobin of 7.4. The patient reports that he has been having melena for the last few 3 days, dark and tarry stools. As well, he reports he has been on aspirin 325 mg 2 times a day for a long time. As well he reports for the last week he has using large doses of ciprofloxacin for shingles, were he was taking large doses on a daily basis  of 2400 mg oral Vicoprofen for his shingles. As well, he reports some dizziness and lightheadedness. The patient denies any previous history of EGD or colonoscopy in the past. Denies any history of gastritis or reflux, or gastric ulcer in the past.   PAST MEDICAL HISTORY: 1.  Depression.   2.  Hypertension.  3.  Vitamin D deficiency.   ALLERGIES: No known drug allergies.   HOME MEDICATIONS: 1.  Aspirin 325 mg p.o. b.i.d.  2.  Lisinopril 30 mg oral daily.  3.  Valium 5 mg oral 2 times a day, as needed.  4.  Mirtazapine 60 mg oral daily.  5.  Buspirone 15 mg oral 2 times a day.  6.  Bupropion 75 mg oral daily.   SOCIAL HISTORY: The patient reports his smoking, but he did cut back from 2 to 1 pack a day, thus he is using bupropion. Denies any alcohol or illicit drug use.   Father died of throat cancer, with a history of alcohol abuse. Mother died at the age of 40 of suicide.   REVIEW OF SYSTEMS: CONSTITUTIONAL: The patient denies fever, chills, fatigue, weight gain, weight loss.  EYES: Denies blurry vision, double vision, inflammation, glaucoma.  ENT: Denies tinnitus, ear pain, hearing loss.  RESPIRATORY: Denies any cough, wheezing, hemoptysis, dyspnea, painful respirations, COPD.  CARDIOVASCULAR: Complains of chest pain, more like heartburn, worsening when laying supine, relieved standing up. Denies any edema, arrhythmia, palpitations, syncope.  GASTROINTESTINAL: Denies nausea, vomiting, diarrhea, abdominal pain, hematemesis. Reports melena.  GENITOURINARY: Denies hematuria, dysuria, hematuria, renal colic.  ENDOCRINE: Denies polyuria, polydipsia, heat or cold intolerance.  HEMATOLOGY: Denies anemia, easy bruising, bleeding diathesis.  INTEGUMENTARY: Denies acne, rash or skin lesions.  MUSCULOSKELETAL: Denies any gout, cramps. Has right elbow pain.  NEUROLOGICAL: Complains of lightheadedness and dizziness, but denies any CVA, TIA, dementia, headache, dysarthria.  PSYCHIATRIC: Has history of anxiety, depression. Denies any schizophrenia, substance or alcohol abuse.   PHYSICAL EXAMINATION: VITAL SIGNS: Temperature 97.8, pulse 77, respiratory rate 16, blood pressure 115/56, breathing 100% on room air.  GENERAL: Elderly male who looks comfortable, in no apparent distress.   HEENT: Head atraumatic, normocephalic. Pupils equal and reactive to light. Pink conjunctivae. Anicteric sclerae. Moist oral mucosa.  NECK: Supple. No thyromegaly. No JVD.  CHEST: Good air entry bilaterally. No wheezing, rales or rhonchi.  ABDOMEN: No tenderness to palpation.  CARDIOVASCULAR: S1, S2 heard. No rubs, murmurs, gallops.  ABDOMEN: Soft, nontender, nondistended. Bowel sounds present.  EXTREMITIES: No edema. No clubbing. No cyanosis. Dorsalis pedis pulses +2 bilaterally.  SKIN: Has shingle rash that is dried and crusted. No vesicular rash in the left chest area.  PSYCHIATRIC: Appropriate affect. Awake, alert x 3. Intact judgment and normal insight.  NEUROLOGIC: Cranial nerves grossly intact. Motor 5/5.  LYMPHATICS: No cervical or axillary, or supraclavicular lymphadenopathy.   PERTINENT LABS: Glucose 118, BUN 49, creatinine 1.52, sodium 137, potassium  3.8, chloride 107, CO2 22.   Troponin 0.70. CK-MB 12.2. Total CK 125, white blood cells 10.7, hemoglobin 7.4, hematocrit 20.9, platelets 293.   ASSESSMENT AND PLAN: 1.  Chest pain: Appears to be atypical, more heartburn-quality. The patient will be admitted. Will continue to cycle his cardiac enzymes.  2.  Elevated troponin: This is most likely due to demand ischemia from his anemia. Will continue to cycle patient's cardiac enzymes and will follow the trend. Coumadin was discussed with cardiology who will see the patient today, and certainly no anticoagulation at this point secondary to his GI bleed.  3.  Anemia, due to gastrointestinal bleed. This is most likely upper gastrointestinal bleed from non-steroidal anti-inflammatory medication use as the patient is using large doses of ibuprofen and aspirin on his own without any physician instructions, but he was doing due to his shingles pain and his elbow pain. At this point, the patient will be kept n.p.o. except for meds and some sips of water. Will be started on Protonix drip. Will  continue to monitor his hemoglobin every  8 hours. Will transfuse him 2 units packed red blood cells. Patient with elevated troponins. Discussed with GI. The patient will most likely have an EGD in the a.m.  4.  Tobacco use: The patient was counseled for 5 minutes. Will start on nicotine patch.  5.  Anxiety and depression: Will continue his home meds.  6.  Hypertension: Will hold meds until he is more stable.  7.  Acute renal failure: Is most likely due to volume depletion from his GI bleed and anemia. Will continue with fluid hydration and replace with volume as blood.  8.  Deep vein thrombosis prophylaxis: Certainly no chemical anticoagulation. Will continue with DVT prophylaxis as sequential compression device.  9.  GI prophylaxis: The patient is on a Protonix drip.  10.  CODE STATUS: THE PATIENT IS A FULL CODE.   Total time spent on admission and patient care: 60 minutes.  patient was sighed off to [facility] service.  ____________________________ Albertine Patricia, MD dse:dm D: 09/25/2012 08:10:26 ET T: 09/25/2012 08:52:58 ET JOB#: [id number]  cc: Albertine Patricia, MD, <Dictator> Zidane Renner Graciela Husbands MD ELECTRONICALLY SIGNED 09/25/2012 23:33

## 2014-06-28 NOTE — Consult Note (Signed)
   Present Illness 74 yo male with no prior cardiac history but history of hypertension and tobacco abuse who e3as admitted after presenting to the er with complaints of dark tarry stools. He was noted to have a hgb of 7.4. He had complaints of chest pain with a troponin of 0.7. He has shingles in the left chest dermatome which has been causing chest pain. He complains of chest pain that he is not sure is his shingles, gerd or something else. Initial ekg was non specific. He continues to have chest pain. He has been taking large amounts of ibuprophen and norco for his shingles.   Physical Exam:  GEN well nourished   HEENT PERRL, hearing intact to voice   NECK supple   RESP normal resp effort  clear BS   CARD Regular rate and rhythm  Normal, S1, S2  No murmur   ABD denies tenderness  normal BS   LYMPH negative neck, negative axillae   EXTR negative cyanosis/clubbing, negative edema   SKIN normal to palpation   NEURO cranial nerves intact, motor/sensory function intact   PSYCH A+O to time, place, person, anxious   Review of Systems:  Subjective/Chief Complaint mid sternal and left lateral chest pain   General: tarry stools and chest pain   Skin: Rashes  left chest shingles   ENT: No Complaints   Eyes: No Complaints   Neck: No Complaints   Respiratory: No Complaints   Cardiovascular: Chest pain or discomfort   Gastrointestinal: Black tarry stools   Genitourinary: No Complaints   Vascular: No Complaints   Musculoskeletal: No Complaints   Neurologic: No Complaints   Hematologic: No Complaints   Endocrine: No Complaints   Psychiatric: Anxiety   Review of Systems: All other systems were reviewed and found to be negative   Medications/Allergies Reviewed Medications/Allergies reviewed   EKG:  EKG NSR   Abnormal NSSTTW changes    No Known Allergies:    Impression 74 yo male with history of shingles and chest pain with no priro cardiac histgory who has  been taking alot of nsaids now with melena, anemia and chest pain with abnormal troponin at 0.7. Recieving blood but still with chest pain. Echo and more blood pending. Will repeat troponin. Not a candidate for urgent cath at present given gi bleeding limiting any intervention which would require antiplatelet therapy. Source of blood loss with help guide further cardiac therapy.   Plan 1. IV fluid bolus. 2. Transfuse as you are doing to get hgb greater than 10 3. Echo 4. Repeat troponin 5. Determine source of blood loss if possible 6. Further recs after echo and repeat troponin   Electronic Signatures: Teodoro Spray (MD)  (Signed 21-Jul-14 11:22)  Authored: General Aspect/Present Illness, History and Physical Exam, Review of System, EKG , Allergies, Impression/Plan   Last Updated: 21-Jul-14 11:22 by Teodoro Spray (MD)

## 2014-06-28 NOTE — Consult Note (Signed)
PATIENT NAME:  [name] [name], [name] [name] MR#:  [id number] DATE OF BIRTH:  1940-12-10  DATE OF ADMISSION:  10/19/2012  DATE OF CONSULTATION:  10/20/2012  REFERRING PHYSICIAN:  Dr. Adrian Prows  CONSULTING PHYSICIAN:  Debbrah Sampedro B. Kerrilyn Azbill, MD  REASON FOR CONSULTATION:  To evaluate a suicidal patient.   IDENTIFYING DATA:  [name] is a 74 year old male with history of depression.   CHIEF COMPLAINT:  "I'm not suicidal."   HISTORY OF PRESENT ILLNESS:  [name] has long history of depression. He believes that he was born this way. He has been treated for depression his entire life. He did psychoanalysis for 2 years. For several years now, he has been in the care of Dr. Bridgett Larsson, who prescribes Remeron, BuSpar and Valium. He has been stable on medications. He had several heart events recently, including CABG done at [facility] in June. Following surgery, he was transferred to a skilled nursing facility, where he believes he received very inadequate care. He was not given his antidepressants, and felt trapped. He was found by a facility worker crying in his room, passively suicidal. He was referred to [facility] medical floor for further care. The patient denied suicidal ideations to his attending physician, but there was a feeling that he still may be passively suicidal. Psychiatry was asked to assess the patient.  The patient adamantly denies any suicidal ideation, intention or plan. He believes the best evidence that he is not suicidal is the fact that he did undergo CABG. If he wanted to die, he would not have done the surgery. He denies any symptoms of depression, anxiety or psychosis. He admits that he was depressed for just one day yesterday, unable to cope with a difficult situation. He is perfectly happy now in our facility. His recovery is progressing well, and the patient will be discharged to home if released by a psychiatrist. He denies alcohol, illicit drugs or prescription pill abuse.    PAST PSYCHIATRIC HISTORY:  As above, long history of depression. He had a period of time when he was treated with Prozac which, as it turns out, was giving him migraine headaches. This resolved when he was switched to Remeron. He is perfectly happy with a combination of Remeron, BuSpar and occasional Valium. He reports that Wellbutrin was added recently to aid smoking cessation. He denies prior hospitalizations or suicide attempts.   FAMILY PSYCHIATRIC HISTORY:  None reported.   PAST MEDICAL HISTORY:  Coronary accident disease, status post CABG, hypertension, dyslipidemia, recent upper GI bleed, chronic renal failure, with creatinine of 1.7.   ALLERGIES:  No known drug allergies.   MEDICATIONS ON ADMISSION:  Amiodarone 200 mg daily, aspirin 81 mg daily, atorvastatin 80 mg daily, Wellbutrin 75 mg twice daily, diazepam 2.5 mg every 12 hours as needed, ferrous sulfate 325 mg twice daily, Lasix 40 mg twice daily, Klor-Con 20 mEq 4 tabs daily, metoprolol 12.5 mg twice daily, mirtazapine 15 mg at bedtime, BuSpar 10 mg twice daily.   SOCIAL HISTORY:  He lives alone. Had been married 3 times, 3 times divorced. He values his freedom and independence. He was in the [employer], in the [employer] for 6 years. 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 He has been retired for the past 9 years, and really enjoys his life.   REVIEW OF SYSTEMS:   CONSTITUTIONAL:  No fevers or chills. No weight changes.  EYES:  No double or blurred vision.  EARS, NOSE, THROAT:  No hearing loss.  RESPIRATORY:  No shortness of breath or cough.  CARDIOVASCULAR:  No chest pain or orthopnea.  GASTROINTESTINAL:  No abdominal pain, nausea, vomiting or diarrhea.  GENITOURINARY:  No incontinence or frequency.  ENDOCRINE:  No heat or cold intolerance.  LYMPHATIC:  No anemia or easy bruising.  INTEGUMENTARY:  No acne or rash.  MUSCULOSKELETAL:  No muscle or joint pain.   NEUROLOGIC:  No tingling or weakness.  PSYCHIATRIC:  See history of present illness for details.   PHYSICAL EXAMINATION: VITAL SIGNS:  Blood pressure 98/65, pulse 85, respirations 18, temperature 98.4.  GENERAL:  This is a well-developed, slender male in no acute distress.  The rest of the physical examination is deferred to his primary attending.   LABORATORY DATA:  Chemistries:  Blood glucose 105, BUN 58, creatinine 2.51, sodium 134, potassium 4.6. Blood alcohol level zero. LFTs within normal limits, except for alkaline phosphatase of 137. TSH 5.01. Urine tox screen positive for benzodiazepines and MDMA. CBC: White blood count 8.4, hemoglobin 11.9, hematocrit 34.1, platelets 408. Urinalysis is not suggestive of urinary tract infection. Serum acetaminophen and salicylates are low. EKG:  Normal sinus rhythm with sinus arrhythmia, lateral infarct, inferior infarct, abnormal EKG.   MENTAL STATUS EXAMINATION:  The patient is alert and oriented to person, place, time and situation. He is pleasant, polite and cooperative. He is well-groomed, wearing a hospital gown. He maintains good eye contact. His speech is soft, although initially he was rather irritated with me, and a little loud. Mood is fine, with full affect. Thought process is logical and goal oriented. Thought content:  He denies suicidal or homicidal ideation. There are no delusions or paranoia. There are no auditory or visual hallucinations. His cognition is grossly intact. He is highly intelligent, and was agitated. His insight and judgment are good.   SUICIDE RISK ASSESSMENT: This is a patient with a lifelong history of depression stable on his regimen of antidepressants prescribed by Dr. Bridgett Larsson. This was discontinued following surgery, but the patient is ready to restart his medications. He is not suicidal or homicidal. He is forward thinking and optimistic about the future.   DIAGNOSES: AXIS I:  Major depressive disorder, recurrent,  moderate.    AXIS II:  Deferred.   AXIS III:  Hypertension, dyslipidemia, coronary artery disease, status post CABG, recent GI bleed.  AXIS IV:  Physical and mental illness.   AXIS V:  GAF 50.   PLAN: 1.  The patient is no longer suicidal. He does not meet criteria for psychiatric admission. Please discharge as appropriate.   2.  I will restart all his medications at the doses prescribed by Dr. Bridgett Larsson, confirmed at the pharmacy. Remeron 60 mg at night, BuSpar 15 mg twice daily, Wellbutrin 75 mg twice daily.  I adjusted the timing of Wellbutrin to 8:00 in the morning and at lunch time. Taken in the evening may cause insomnia. He will continue on Valium 5 mg as needed for anxiety.    ____________________________ Wardell Honour Bary Leriche, MD jbp:mr D: 10/20/2012 18:33:00 ET T: 10/20/2012 22:00:16 ET JOB#: [id number]  cc: Felicia Both B. Bary Leriche, MD, <Dictator> Clovis Fredrickson MD ELECTRONICALLY SIGNED 10/21/2012 15:14

## 2014-06-28 NOTE — Discharge Summary (Signed)
PATIENT NAME:  [name] [name], [name] [name] MR#:  [id number] DATE OF BIRTH:  1940/09/25  DATE OF ADMISSION:  09/25/2012 DATE OF DISCHARGE:  09/26/2012  CONSULTING CARDIOLOGIST:  Dr. Suszanne Conners GASTROENTEROLOGIST:  Dr. Tiffany Kocher  DISCHARGE DIAGNOSES: 1.  Non-ST elevation myocardial infarction.  2.  Gastrointestinal bleed with anemia.   HISTORY OF PRESENT ILLNESS: Please see admission history and physical for details. Briefly, this is a 74 year old gentleman admitted with chest pain. He was also having some melena for the last several days. He has been using aspirin twice a day for many years. He had also been taking Vicoprofen for shingles that he started recently.   HOSPITAL COURSE BY ISSUE:  1. The patient was admitted and ended up ruling for non-ST elevation myocardial infarction. He was seen by Dr. Ubaldo Glassing who did cardiac catheterization revealing three-vessel disease. He continued to have off and on chest pain. He was transferred to [facility] for coronary artery bypass graft evaluation.  2.  Gastrointestinal bleed. The patient was seen by gastroenterology. He was transferred 4 units of packed red blood cells and stabilized. He was started on and continued on IV proton pump inhibitor.  His aspirin was held.  3.  Anxiety. This is the main issue for the patient. We continued him on his outpatient Valium. He was very concerned about getting surgery on his heart and had many questions.   DISCHARGE MEDICATIONS: 1.  Lisinopril 5 mg once a day.  2.  Atorvastatin 40 mg once a day.  3.  Diazepam 5 mg every eight hours.  4.  BuSpar 15 mg 1 tablet twice a day.  5.  Metoprolol 25 mg twice a day.   DISCHARGE DIET: Low sodium, low cholesterol diet.   FOLLOW-UP: The patient will follow up with Dr. Ubaldo Glassing and Dr. Ola Spurr following discharge from [facility].   TIME SPENT: This discharge took 35 minutes   ____________________________ Cheral Marker. Ola Spurr, MD dpf:cc D: 10/16/2012 15:11:49  ET T: 10/16/2012 16:03:58 ET JOB#: [id number]  cc: Cheral Marker. Ola Spurr, MD, <Dictator> Lorea Kupfer Ola Spurr MD ELECTRONICALLY SIGNED 10/16/2012 21:55

## 2014-06-28 NOTE — Discharge Summary (Signed)
PATIENT NAME:  [name] [name], [name] [name] MR#:  [id number] DATE OF BIRTH:  1940/06/22  DATE OF ADMISSION:  10/19/2012 DATE OF DISCHARGE:  10/22/2012  DISCHARGE DIAGNOSES 1.  Acute renal failure, mostly prerenal with component of acute tubular necrosis.   2.  Acute tubular necrosis.  3.  Coronary artery disease, status post coronary artery bypass graft approximately 2 weeks ago. 4.  Dehydration. 5.  Major depression precipitated by stopping antidepressants postoperatively. 6.  Tobacco abuse, on nicotine replacement recently.    HISTORY AND PHYSICAL:  Please see detailed history and physical.    DISCHARGE MEDICATIONS: Done in [facility] med reconciliation on the discharge day sheet, please see for details.    HOSPITAL COURSE:  The patient was admitted, likely dehydrated post diuresis postop.  He had a renal ultrasound done which was read as normal without masses, etc., and his creatinine was 3.2 on admission, decreased with IV fluids to 1.5, baseline is approximately 1.3 per [facility] outpatient records.  He is eating well, drinking well, ambulating without difficulty prior to discharge.  Chest x-rays are normal as well.  His bypass was approximately 2 weeks ago.  He was having some erythema on his chest wall surrounding the staple, insertion site only, so these will be taken out and Steri-Strips will be put on.  Urine drug screen was done which was positive for benzodiazepines which he is on and MDMA on the urine qualitative screen only.  Ethanol level was normal.  There were basically unremarkable labs otherwise.    He will see Dr. Ubaldo Glassing and Dr. Ola Spurr soon.  Prescriptions for his new meds were written.  No need for home health at this point.    I spent approximately 35 minutes doing all the discharge tasks today.    ____________________________ Ocie Cornfield. Ouida Sills, MD mwa:cs D: 10/22/2012 11:53:30 ET T: 10/22/2012 14:01:51 ET JOB#: [id number]  cc: Ocie Cornfield. Ouida Sills, MD, <Dictator> Kirk Ruths  MD ELECTRONICALLY SIGNED 10/24/2012 16:26

## 2014-06-30 NOTE — H&P (Signed)
PATIENT NAME:  [name] [name], [name] [name] MR#:  [id number] DATE OF BIRTH:  05-Apr-1940  DATE OF ADMISSION:  06/28/2011  HISTORY OF PRESENT ILLNESS: [name] [name] is a 74 year old white male who experienced a painful, tender firm area on the left upper side of his anus or buttocks about three months ago and after a few days it resolved spontaneously. He then had a recurrence of these symptoms five days ago and has gotten worse such that it woke him up at about 4 o'clock  this morning. His last meal was at midnight. He has had a number of infected sebaceous cysts that have been drained and/or removed in the past. He had one on his buttock in the 1970s and then he had another that he describes as in the midline, at the end of his spine, in the 1990s. He has also had infected sebaceous cyst of the left chest wall beneath his left breast in the remote past. He denies fever and any change in his bowel habits.   ALLERGIES: No known drug allergies.   MEDICATIONS:  1. BuSpar. 2. Remeron. 3. Lisinopril 30 mg daily.   PAST MEDICAL HISTORY: Hypertension.   REVIEW OF SYSTEMS: Negative for 10 systems, except as mentioned in the history of present illness above. Specifically, the patient has not had fever, diarrhea, or constipation.   FAMILY HISTORY: Noncontributory.   SOCIAL HISTORY: The patient is single. He smokes two packs of cigarettes per day and drinks a rare beer. He is retired from [employer] and is also a retired Dance movement psychotherapist.   PHYSICAL EXAMINATION:   GENERAL: A pleasant elderly white male in no distress. Height 5 feet 11 inches, weight 180 pounds, and BMI 25.1.   VITAL SIGNS: Temperature 97.8, pulse 83, respirations 20, blood pressure 136/71, and oxygen saturation 97% on room air.   HEENT: Pupils are equally round and reactive to light. Extraocular movements are intact. Sclerae are anicteric. Oropharynx clear. Mucous membranes moist. Dentition good.   NECK: Supple with no lymphadenopathy. The trachea is  midline and there is no jugular venous distention.   HEART: Regular rate and rhythm with no murmurs or rubs.   LUNGS: Clear to auscultation with normal respiratory effort bilaterally.   ABDOMEN: Soft, nontender, and nondistended with no palpable hepatosplenomegaly or other masses.  GENITOURINARY: Clear of the skin lesions. Perianal exam reveals indurated erythema in the right upper perianal region with some scar formation from previous drainages.   EXTREMITIES: No edema with normal capillary refill bilaterally.   NEUROLOGIC: Cranial nerves II through XII motor and sensation grossly intact.   PSYCHIATRIC: Alert and oriented x4. Appropriate affect.   ADDITIONAL STUDIES: White blood cell count 11.4 with mild left shift, hemoglobin 12.4, and hematocrit 37%. PT/ INR and PTT normal. Electrolytes normal with the exception of a BUN of 28 and creatinine of 1.25.   ASSESSMENT: Perianal abscess/cellulitis of unclear etiology. This may be an infected sebaceous cyst or it may be pilonidal disease or it may be a perirectal abscess.   PLAN: Admit to the hospital for IV fluids, IV antibiotics, and examination of the rectum under anesthesia and drainage of this abscess. The patient has been doing sitz baths already and      understands that he will likely be doing sitz baths postoperatively and whether he requires admission to the hospital overnight or can be performed as an outpatient is yet to be determined. He understands this as well. ____________________________ Consuela Mimes, MD wfm:slb D: 06/28/2011 22:02:54 ET T:  06/28/2011 09:10:06 ET JOB#: [id number]  cc: Consuela Mimes, MD, <Dictator> Consuela Mimes MD ELECTRONICALLY SIGNED 06/29/2011 9:59

## 2014-06-30 NOTE — Op Note (Signed)
PATIENT NAME:  [name] [name], [name] [name] MR#:  [id number] DATE OF BIRTH:  [birth year]  DATE OF PROCEDURE:  06/28/2011  PREOPERATIVE DIAGNOSIS: Right perianal abscess.    POSTOPERATIVE DIAGNOSIS: Right perianal abscess. (Infected 32 mm perianal sebaceous cyst), 12 mm natal cleft sebaceous cyst.      PROCEDURES:    1. Anorectal examination under anesthesia.  2. Incision and drainage of right ischiorectal abscess.  3. Excision of 32 mm perianal sebaceous cyst.  4. Excision of 12 mm natal cleft sebaceous cyst.   SURGEON:  Consuela Mimes, MD.   ANESTHESIA: General.  PROCEDURE IN DETAIL: The patient was placed in the prone position and his buttock cheeks were taped laterally and he was prepped and draped in the usual sterile fashion. An examination under anesthesia digitally was performed with digital anorectal examination and the patient's prostate was a little large but not nodular. There were no masses within the anal canal or rectal vault and the anal canal was very smooth. An anoscopy was performed which revealed grade 2 combined hemorrhoids but no anal canal pathology that would explain the abscess. The abscess was drained in a radial fashion, taking care to stay as far away from the anal canal skin as possible and it drained a significant amount of creamy white pus. This was cultured. It was apparent that this abscess was an infected sebaceous cyst and the sebaceous cyst wall was only destroyed by the necrotizing infection towards the anus and the remainder of the sebaceous cyst wall was intact. The majority of the contents of the sebaceous cyst were intact and not infected as well. Therefore, the sebaceous cyst wall was completely excised sharply and this sebaceous cyst went all the way down to the pelvic floor musculature and external anal sphincter. Hemostasis was achieved of the abscess/sebaceous cyst cavity with the electrocautery. The wound was irrigated and then packed with saline-soaked two inch  Kling. In addition, the patient had a 12 mm sebaceous cyst that was in the natal cleft that I originally confused for possible pilonidal    disease. This cyst wall was completely excised as well, but no closure was performed in either of the areas. The patient tolerated the procedure well and there were no complications.   ____________________________ Consuela Mimes, MD wfm:ap D: 06/28/2011 14:06:39 ET T: 06/28/2011 14:47:32 ET JOB#: [id number]  cc: Consuela Mimes, MD, <Dictator>  Consuela Mimes MD ELECTRONICALLY SIGNED 06/29/2011 9:59

## 2015-02-18 ENCOUNTER — Encounter: Payer: Self-pay | Admitting: *Deleted

## 2015-02-19 ENCOUNTER — Encounter
Admission: RE | Disposition: A | Discharge status: Home / Self Care | Payer: Self-pay | Source: Ambulatory Visit | Attending: Surgery

## 2015-02-19 ENCOUNTER — Encounter: Payer: Self-pay | Admitting: Anesthesiology

## 2015-02-19 ENCOUNTER — Ambulatory Visit
Admission: RE | Admit: 2015-02-19 | Discharge: 2015-02-19 | Disposition: A | Discharge status: Home / Self Care | Payer: Medicare Other | Source: Ambulatory Visit | Attending: Surgery | Admitting: Surgery

## 2015-02-19 ENCOUNTER — Encounter: Payer: Medicare Other | Admitting: Anesthesiology

## 2015-02-19 DIAGNOSIS — C4911 Malignant neoplasm of connective and soft tissue of right upper limb, including shoulder: Secondary | ICD-10-CM | POA: Diagnosis not present

## 2015-02-19 DIAGNOSIS — Z79899 Other long term (current) drug therapy: Secondary | ICD-10-CM | POA: Insufficient documentation

## 2015-02-19 DIAGNOSIS — M67431 Ganglion, right wrist: Secondary | ICD-10-CM | POA: Diagnosis present

## 2015-02-19 DIAGNOSIS — I252 Old myocardial infarction: Secondary | ICD-10-CM | POA: Diagnosis not present

## 2015-02-19 DIAGNOSIS — F419 Anxiety disorder, unspecified: Secondary | ICD-10-CM | POA: Diagnosis not present

## 2015-02-19 DIAGNOSIS — Z811 Family history of alcohol abuse and dependence: Secondary | ICD-10-CM | POA: Insufficient documentation

## 2015-02-19 DIAGNOSIS — Z79891 Long term (current) use of opiate analgesic: Secondary | ICD-10-CM | POA: Diagnosis not present

## 2015-02-19 DIAGNOSIS — I10 Essential (primary) hypertension: Secondary | ICD-10-CM | POA: Diagnosis not present

## 2015-02-19 DIAGNOSIS — F329 Major depressive disorder, single episode, unspecified: Secondary | ICD-10-CM | POA: Insufficient documentation

## 2015-02-19 DIAGNOSIS — Z951 Presence of aortocoronary bypass graft: Secondary | ICD-10-CM | POA: Diagnosis not present

## 2015-02-19 DIAGNOSIS — Z800 Family history of malignant neoplasm of digestive organs: Secondary | ICD-10-CM | POA: Insufficient documentation

## 2015-02-19 DIAGNOSIS — E785 Hyperlipidemia, unspecified: Secondary | ICD-10-CM | POA: Insufficient documentation

## 2015-02-19 DIAGNOSIS — Z7982 Long term (current) use of aspirin: Secondary | ICD-10-CM | POA: Insufficient documentation

## 2015-02-19 HISTORY — DX: Muscle spasm of back: M62.830

## 2015-02-19 HISTORY — DX: Depression, unspecified: F32.A

## 2015-02-19 HISTORY — DX: Vitamin D deficiency, unspecified: E55.9

## 2015-02-19 HISTORY — DX: Acute myocardial infarction, unspecified: I21.9

## 2015-02-19 HISTORY — DX: Spondylosis without myelopathy or radiculopathy, lumbar region: M47.816

## 2015-02-19 HISTORY — DX: Atherosclerotic heart disease of native coronary artery without angina pectoris: I25.10

## 2015-02-19 HISTORY — DX: Unspecified osteoarthritis, unspecified site: M19.90

## 2015-02-19 HISTORY — DX: Hyperlipidemia, unspecified: E78.5

## 2015-02-19 HISTORY — PX: MASS EXCISION: SHX2000

## 2015-02-19 HISTORY — DX: Major depressive disorder, single episode, unspecified: F32.9

## 2015-02-19 HISTORY — DX: Anxiety disorder, unspecified: F41.9

## 2015-02-19 HISTORY — DX: Phlebitis and thrombophlebitis of lower extremities, unspecified: I80.3

## 2015-02-19 HISTORY — DX: Radiculopathy, lumbosacral region: M54.17

## 2015-02-19 HISTORY — DX: Other intervertebral disc degeneration, lumbar region: M51.36

## 2015-02-19 SURGERY — EXCISION MASS
Anesthesia: LOCAL | Site: Hand | Laterality: Right | Wound class: Clean

## 2015-02-19 MED ORDER — LIDOCAINE HCL 1 % IJ SOLN
INTRAMUSCULAR | Status: DC | PRN
  Administered 2015-02-19: 17 mL via INTRAMUSCULAR

## 2015-02-19 MED ORDER — OXYCODONE HCL 5 MG PO TABS: 5.0000 mg | ORAL_TABLET | ORAL | Status: DC | PRN

## 2015-02-19 MED ORDER — CEFAZOLIN SODIUM-DEXTROSE 2-3 GM-% IV SOLR
2.0000 g | Freq: Once | INTRAVENOUS | Status: AC
  Administered 2015-02-19: 2 g via INTRAVENOUS

## 2015-02-19 SURGICAL SUPPLY — 33 items
BANDAGE ELASTIC 2 VELCRO NS LF (GAUZE/BANDAGES/DRESSINGS) ×2 IMPLANT
BANDAGE ELASTIC 3 VELCRO NS (GAUZE/BANDAGES/DRESSINGS) IMPLANT
BANDAGE ELASTIC 4 VELCRO NS (GAUZE/BANDAGES/DRESSINGS) IMPLANT
BANDAGE ELASTIC 6 VELCRO NS (GAUZE/BANDAGES/DRESSINGS) IMPLANT
BNDG COHESIVE 4X5 TAN STRL (GAUZE/BANDAGES/DRESSINGS) IMPLANT
BNDG ESMARK 4X12 TAN STRL LF (GAUZE/BANDAGES/DRESSINGS) ×2 IMPLANT
BNDG ESMARK 6X12 TAN STRL LF (GAUZE/BANDAGES/DRESSINGS) IMPLANT
CANISTER SUCT 1200ML W/VALVE (MISCELLANEOUS) ×2 IMPLANT
CHLORAPREP W/TINT 26ML (MISCELLANEOUS) ×2 IMPLANT
CORD BIP STRL DISP 12FT (MISCELLANEOUS) ×2 IMPLANT
COVER LIGHT HANDLE UNIVERSAL (MISCELLANEOUS) ×4 IMPLANT
CUFF TOURN SGL QUICK 18 (TOURNIQUET CUFF) ×2 IMPLANT
CUFF TOURN SGL QUICK 24 (TOURNIQUET CUFF)
CUFF TRNQT CYL 24X4X40X1 (TOURNIQUET CUFF) IMPLANT
GAUZE PETRO XEROFOAM 1X8 (MISCELLANEOUS) ×2 IMPLANT
GAUZE SPONGE 4X4 12PLY STRL (GAUZE/BANDAGES/DRESSINGS) ×2 IMPLANT
GLOVE BIO SURGEON STRL SZ8 (GLOVE) ×4 IMPLANT
GLOVE INDICATOR 8.0 STRL GRN (GLOVE) ×2 IMPLANT
GOWN STRL REUS W/ TWL LRG LVL3 (GOWN DISPOSABLE) ×1 IMPLANT
GOWN STRL REUS W/ TWL XL LVL3 (GOWN DISPOSABLE) ×1 IMPLANT
GOWN STRL REUS W/TWL LRG LVL3 (GOWN DISPOSABLE) ×1
GOWN STRL REUS W/TWL XL LVL3 (GOWN DISPOSABLE) ×1
KIT ROOM TURNOVER OR (KITS) ×2 IMPLANT
NEEDLE HYPO 21X1.5 SAFETY (NEEDLE) IMPLANT
NS IRRIG 500ML POUR BTL (IV SOLUTION) ×2 IMPLANT
PACK EXTREMITY ARMC (MISCELLANEOUS) ×2 IMPLANT
PAD GROUND ADULT SPLIT (MISCELLANEOUS) IMPLANT
STOCKINETTE IMPERVIOUS LG (DRAPES) IMPLANT
STRAP BODY AND KNEE 60X3 (MISCELLANEOUS) ×2 IMPLANT
SUT PROLENE 4 0 PS 2 18 (SUTURE) ×2 IMPLANT
SUT VIC AB 3-0 SH 27 (SUTURE)
SUT VIC AB 3-0 SH 27X BRD (SUTURE) IMPLANT
SYR 20CC LL (SYRINGE) IMPLANT

## 2015-02-19 NOTE — OR Nursing (Signed)
Pre op vitals:   Time: 1219  By Merita Norton, RN    170/89                         100% pulse ox on room air   69 pulse   18 resp.   Post op vitals:  Time: 1252  By Merita Norton, RN    155/86   98% pulse ox on room air   64 pulse   18 resp.

## 2015-02-19 NOTE — Op Note (Signed)
02/19/2015  2:42 PM  Patient:   [name] [name]  Pre-Op Diagnosis:   Soft tissue mass dorsal aspect of right hand.  Post-Op Diagnosis:   Same.  Procedure:   Excision of soft tissue mass dorsal aspect of right hand.  Surgeon:   Pascal Lux, MD  Anesthesia:   Local  Findings:   As above.  Complications:   None  EBL:   10 cc  Fluids:   300 cc crystalloid  TT:   None  Drains:   None  Closure:   4-0 proline interrupted sutures  Brief Clinical Note:   The patient is a 74 year old male who has noted a 6 week history of the progressive growth of a small painful mass on the dorsal ulnar aspect of the right hand near the base of the fifth metacarpal. The patient denies any injury to the area, nor is noting any drainage from the mass. He has no constitutional symptoms. The patient presents at this time for excision of the soft tissue mass.  Procedure:   The patient was brought into the operating room and lain in the supine position. A timeout was performed to verify the appropriate surgical site. Using sterile technique, a total of 17 cc of a 50:50 mixture of 0.5% Sensorcaine and 1% lidocaine was injected into the subcutaneous tissues circumferentially around the mass. The right hand and upper extremity were prepped with ChloraPrep solution before being draped sterilely. Preoperative antibiotics were administered. A longitudinally-oriented elliptical incision was made around the mass. The incision was carried down through the subcutaneous tissues with care taken to identify and protect any neurovascular structures. Blunt dissection was carried out beneath the mass to be sure it was well separated from the underlying tendinous structures before it was removed. The mass was taken to the back table then prepared to be sent to pathology for definitive identification.  The wound was copiously irrigated with sterile saline solution before the skin was closed using 4-0 proline interrupted sutures  before a sterile bulky dressing was applied to the wrist. The patient was then returned to the recovery room in satisfactory condition after tolerating the procedure well.

## 2015-02-19 NOTE — H&P (Signed)
Paper H&P to be scanned into permanent record. H&P reviewed. No changes. 

## 2015-02-19 NOTE — Discharge Instructions (Signed)
Keep dressing dry and intact. Keep hand elevated above heart level. May shower after dressing removed on postop day 4 (Sunday). Cover sutures with ace wrap after drying off. Apply ice to affected area frequently. Return for follow-up in 10-14 days or as scheduled.

## 2015-02-20 ENCOUNTER — Encounter: Payer: Self-pay | Admitting: Surgery

## 2015-02-21 LAB — SURGICAL PATHOLOGY

## 2015-07-21 ENCOUNTER — Emergency Department: Payer: Medicare Other

## 2015-07-21 ENCOUNTER — Emergency Department
Admission: EM | Admit: 2015-07-21 | Discharge: 2015-07-21 | Disposition: A | Discharge status: Home / Self Care | Payer: Medicare Other | Attending: Emergency Medicine | Admitting: Emergency Medicine

## 2015-07-21 ENCOUNTER — Encounter: Payer: Self-pay | Admitting: Emergency Medicine

## 2015-07-21 DIAGNOSIS — I2510 Atherosclerotic heart disease of native coronary artery without angina pectoris: Secondary | ICD-10-CM | POA: Diagnosis not present

## 2015-07-21 DIAGNOSIS — Z951 Presence of aortocoronary bypass graft: Secondary | ICD-10-CM | POA: Insufficient documentation

## 2015-07-21 DIAGNOSIS — F329 Major depressive disorder, single episode, unspecified: Secondary | ICD-10-CM | POA: Diagnosis not present

## 2015-07-21 DIAGNOSIS — E785 Hyperlipidemia, unspecified: Secondary | ICD-10-CM | POA: Diagnosis not present

## 2015-07-21 DIAGNOSIS — F17200 Nicotine dependence, unspecified, uncomplicated: Secondary | ICD-10-CM | POA: Diagnosis not present

## 2015-07-21 DIAGNOSIS — I252 Old myocardial infarction: Secondary | ICD-10-CM | POA: Insufficient documentation

## 2015-07-21 DIAGNOSIS — R1030 Lower abdominal pain, unspecified: Secondary | ICD-10-CM | POA: Diagnosis present

## 2015-07-21 DIAGNOSIS — Z79899 Other long term (current) drug therapy: Secondary | ICD-10-CM | POA: Diagnosis not present

## 2015-07-21 DIAGNOSIS — Z7982 Long term (current) use of aspirin: Secondary | ICD-10-CM | POA: Insufficient documentation

## 2015-07-21 DIAGNOSIS — M1990 Unspecified osteoarthritis, unspecified site: Secondary | ICD-10-CM | POA: Insufficient documentation

## 2015-07-21 DIAGNOSIS — K529 Noninfective gastroenteritis and colitis, unspecified: Secondary | ICD-10-CM | POA: Diagnosis not present

## 2015-07-21 DIAGNOSIS — M5136 Other intervertebral disc degeneration, lumbar region: Secondary | ICD-10-CM | POA: Insufficient documentation

## 2015-07-21 LAB — URINALYSIS COMPLETE WITH MICROSCOPIC (ARMC ONLY)
Bilirubin Urine: NEGATIVE
Glucose, UA: NEGATIVE mg/dL
Ketones, ur: NEGATIVE mg/dL
Leukocytes, UA: NEGATIVE
Nitrite: NEGATIVE
Protein, ur: NEGATIVE mg/dL
Specific Gravity, Urine: 1.005 (ref 1.005–1.030)
Squamous Epithelial / LPF: NONE SEEN
pH: 5 (ref 5.0–8.0)

## 2015-07-21 LAB — COMPREHENSIVE METABOLIC PANEL
ALT: 16 U/L — ABNORMAL LOW (ref 17–63)
AST: 15 U/L (ref 15–41)
Albumin: 4.1 g/dL (ref 3.5–5.0)
Alkaline Phosphatase: 61 U/L (ref 38–126)
Anion gap: 8 (ref 5–15)
BUN: 18 mg/dL (ref 6–20)
CO2: 20 mmol/L — ABNORMAL LOW (ref 22–32)
Calcium: 9 mg/dL (ref 8.9–10.3)
Chloride: 109 mmol/L (ref 101–111)
Creatinine, Ser: 0.96 mg/dL (ref 0.61–1.24)
GFR calc Af Amer: >60 mL/min (ref >60)
GFR calc non Af Amer: >60 mL/min (ref >60)
Glucose, Bld: 105 mg/dL — ABNORMAL HIGH (ref 65–99)
Potassium: 3.4 mmol/L — ABNORMAL LOW (ref 3.5–5.1)
Sodium: 137 mmol/L (ref 135–145)
Total Bilirubin: 0.5 mg/dL (ref 0.3–1.2)
Total Protein: 6.8 g/dL (ref 6.5–8.1)

## 2015-07-21 LAB — CBC
HEMATOCRIT: 42.4 % (ref 40.0–52.0)
Hemoglobin: 14.5 g/dL (ref 13.0–18.0)
MCH: 30.9 pg (ref 26.0–34.0)
MCHC: 34.3 g/dL (ref 32.0–36.0)
MCV: 90 fL (ref 80.0–100.0)
PLATELETS: 183 10*3/uL (ref 150–440)
RBC: 4.71 MIL/uL (ref 4.40–5.90)
RDW: 14.2 % (ref 11.5–14.5)
WBC: 10.7 10*3/uL — AB (ref 3.8–10.6)

## 2015-07-21 LAB — LIPASE, BLOOD: LIPASE: 17 U/L (ref 11–51)

## 2015-07-21 MED ORDER — ONDANSETRON 4 MG PO TBDP: 4.0000 mg | ORAL_TABLET | Freq: Three times a day (TID) | ORAL | Status: DC | PRN

## 2015-07-21 MED ORDER — IOPAMIDOL (ISOVUE-300) INJECTION 61%
100.0000 mL | Freq: Once | INTRAVENOUS | Status: AC | PRN
  Administered 2015-07-21: 100 mL via INTRAVENOUS

## 2015-07-21 MED ORDER — CIPROFLOXACIN HCL 500 MG PO TABS: 500.0000 mg | ORAL_TABLET | Freq: Two times a day (BID) | ORAL | Status: AC

## 2015-07-21 MED ORDER — DIATRIZOATE MEGLUMINE & SODIUM 66-10 % PO SOLN
15.0000 mL | Freq: Once | ORAL | Status: AC
  Administered 2015-07-21: 15 mL via ORAL

## 2015-07-21 MED ORDER — NICOTINE 14 MG/24HR TD PT24
MEDICATED_PATCH | TRANSDERMAL | Status: AC
  Administered 2015-07-21: 14 mg via TRANSDERMAL
  Filled 2015-07-21: qty 1

## 2015-07-21 MED ORDER — ONDANSETRON HCL 4 MG/2ML IJ SOLN
4.0000 mg | Freq: Once | INTRAMUSCULAR | Status: AC
  Administered 2015-07-21: 4 mg via INTRAVENOUS

## 2015-07-21 MED ORDER — SODIUM CHLORIDE 0.9 % IV BOLUS (SEPSIS)
1000.0000 mL | Freq: Once | INTRAVENOUS | Status: AC
  Administered 2015-07-21: 1000 mL via INTRAVENOUS

## 2015-07-21 MED ORDER — OXYCODONE-ACETAMINOPHEN 5-325 MG PO TABS: 1.0000 | ORAL_TABLET | Freq: Four times a day (QID) | ORAL | Status: DC | PRN

## 2015-07-21 MED ORDER — ONDANSETRON HCL 4 MG/2ML IJ SOLN
4.0000 mg | Freq: Once | INTRAMUSCULAR | Status: AC
  Administered 2015-07-21: 4 mg via INTRAVENOUS
  Filled 2015-07-21: qty 2

## 2015-07-21 MED ORDER — NICOTINE 14 MG/24HR TD PT24
14.0000 mg | MEDICATED_PATCH | Freq: Once | TRANSDERMAL | Status: DC
  Administered 2015-07-21: 14 mg via TRANSDERMAL

## 2015-07-21 MED ORDER — METRONIDAZOLE 500 MG PO TABS: 500.0000 mg | ORAL_TABLET | Freq: Three times a day (TID) | ORAL | Status: AC

## 2015-07-21 MED ORDER — CIPROFLOXACIN HCL 500 MG PO TABS
500.0000 mg | ORAL_TABLET | Freq: Once | ORAL | Status: AC
  Administered 2015-07-21: 500 mg via ORAL
  Filled 2015-07-21: qty 1

## 2015-07-21 MED ORDER — ONDANSETRON HCL 4 MG/2ML IJ SOLN
INTRAMUSCULAR | Status: AC
  Administered 2015-07-21: 4 mg via INTRAVENOUS
  Filled 2015-07-21: qty 2

## 2015-07-21 MED ORDER — OXYCODONE-ACETAMINOPHEN 5-325 MG PO TABS
2.0000 | ORAL_TABLET | Freq: Once | ORAL | Status: AC
  Administered 2015-07-21: 2 via ORAL
  Filled 2015-07-21: qty 2

## 2015-07-21 MED ORDER — ONDANSETRON 4 MG PO TBDP
4.0000 mg | ORAL_TABLET | Freq: Once | ORAL | Status: AC | PRN
  Administered 2015-07-21: 4 mg via ORAL

## 2015-07-21 MED ORDER — METRONIDAZOLE 500 MG PO TABS
500.0000 mg | ORAL_TABLET | Freq: Once | ORAL | Status: AC
  Administered 2015-07-21: 500 mg via ORAL
  Filled 2015-07-21: qty 1

## 2015-07-21 MED ORDER — MORPHINE SULFATE (PF) 4 MG/ML IV SOLN
4.0000 mg | Freq: Once | INTRAVENOUS | Status: AC
  Administered 2015-07-21: 4 mg via INTRAVENOUS
  Filled 2015-07-21: qty 1

## 2015-07-21 NOTE — Discharge Instructions (Signed)
You have been seen in the emergency department for enteritis, similar to colitis an infection of the small intestine. This could be due to bacteria or a virus. Please take your antibiotics as prescribed. Pain medication and nausea medication as needed, as written. Please follow-up with her primary care physician in 2-3 days for recheck/reevaluation. Return to the emergency department for any worsening pain, vomiting, or fever.   Colitis Colitis is inflammation of the colon. Colitis may last a short time (acute) or it may last a long time (chronic). CAUSES This condition may be caused by:  Viruses.  Bacteria.  Reactions to medicine.  Certain autoimmune diseases, such as Crohn disease or ulcerative colitis. SYMPTOMS Symptoms of this condition include:  Diarrhea.  Passing bloody or tarry stool.  Pain.  Fever.  Vomiting.  Tiredness (fatigue).  Weight loss.  Bloating.  Sudden increase in abdominal pain.  Having fewer bowel movements than usual. DIAGNOSIS This condition is diagnosed with a stool test or a blood test. You may also have other tests, including X-rays, a CT scan, or a colonoscopy. TREATMENT Treatment may include:  Resting the bowel. This involves not eating or drinking for a period of time.  Fluids that are given through an IV tube.  Medicine for pain and diarrhea.  Antibiotic medicines.  Cortisone medicines.  Surgery. HOME CARE INSTRUCTIONS Eating and Drinking  Follow instructions from your health care provider about eating or drinking restrictions.  Drink enough fluid to keep your urine clear or pale yellow.  Work with a dietitian to determine which foods cause your condition to flare up.  Avoid foods that cause flare-ups.  Eat a well-balanced diet. Medicines  Take over-the-counter and prescription medicines only as told by your health care provider.  If you were prescribed an antibiotic medicine, take it as told by your health care  provider. Do not stop taking the antibiotic even if you start to feel better. General Instructions  Keep all follow-up visits as told by your health care provider. This is important. SEEK MEDICAL CARE IF:  Your symptoms do not go away.  You develop new symptoms. SEEK IMMEDIATE MEDICAL CARE IF:  You have a fever that does not go away with treatment.  You develop chills.  You have extreme weakness, fainting, or dehydration.  You have repeated vomiting.  You develop severe pain in your abdomen.  You pass bloody or tarry stool.   This information is not intended to replace advice given to you by your health care provider. Make sure you discuss any questions you have with your health care provider.   Document Released: 04/01/2004 Document Revised: 11/13/2014 Document Reviewed: 06/17/2014 Elsevier Interactive Patient Education [employer].

## 2015-07-21 NOTE — ED Notes (Signed)

## 2015-07-21 NOTE — ED Notes (Addendum)
Pt arrived via EMS from home c/o mid abd pain that started yesterday with loose stools. States that he is having nausea with "intermittent severe pain." Pt reports that he thinks he may have IBS but isn't sure. Pt also reports that the only thing that he has eaten today is oatmeal. EMS reports that the patient met them at the ambulance. Pt hypertensive, has hx of HTN and has not taken his nighttime medications. Pt arrived in the ED in NAD. Skin is pink and dry.

## 2015-07-21 NOTE — ED Provider Notes (Signed)
[facility] Emergency Department Provider Note  Time seen: 1:21 AM  I have reviewed the triage vital signs and the nursing notes.   HISTORY  Chief Complaint Abdominal Pain and Diarrhea    HPI [name] [name] is a 75 y.o. male with a past medical history of MI, anxiety, arthritis, hyperlipidemia, presents the emergency department with lower abdominal pain. According to the patient for the past 2 days he has been having excessive flatulence which stopped earlier today. Now he is having significant lower abdominal pain which he states comes in waves, is intermittent lasting for 5-10 minutes when it occurs, coming every 10-20 minutes. Patient states no discomfort currently but during the episodes he has severe discomfort. Describes it as sharp pain 10/10 in severity. Patient has been nauseated, but denies vomiting. He has had loose stools which he describes as watery, but states they're very small. Patient states when the discomfort does occur it is across his lower abdomen.     Past Medical History  Diagnosis Date  . 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 Anxiety   . Depression   . Coronary artery disease   . Arthritis     right hand, lower back  . Degenerative disc disease, lumbar   . Lumbar spondylosis   . Radiculitis, lumbosacral   . Lumbar paraspinal muscle spasm   . Phlebitis of leg, left     s/p vein harvest for by-pass graft  . Vitamin D deficiency   . Hyperlipidemia     There are no active problems to display for this patient.   Past Surgical History  Procedure Laterality Date  . 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 Esophagogastroduodenoscopy    . Cyst excision      anal cyst  . Tonsillectomy      age 43  . Mass excision Right 02/19/2015    Procedure: EXCISION MASS OF SOFT TISSUE RIGHT HAND;  Surgeon: Corky Mull, MD;  Location: [place];  Service: Orthopedics;  Laterality: Right;  LOCAL PER DR POGGI     Current Outpatient Rx  Name  Route  Sig  Dispense  Refill  . aspirin 81 MG tablet   Oral   Take 81 mg by mouth daily.         Marland Kitchen atorvastatin (LIPITOR) 80 MG tablet   Oral   Take 40 mg by mouth 2 (two) times daily. 1/2 of 80 mg tablet AM & PM         . busPIRone (BUSPAR) 15 MG tablet   Oral   Take 15 mg by mouth 2 (two) times daily.         . diazepam (VALIUM) 5 MG tablet   Oral   Take 5 mg by mouth every 6 (six) hours as needed for anxiety.         Marland Kitchen lisinopril (PRINIVIL,ZESTRIL) 30 MG tablet   Oral   Take 30 mg by mouth daily. PM         . metoprolol tartrate (LOPRESSOR) 25 MG tablet   Oral   Take 12.5 mg by mouth 2 (two) times daily.         . mirtazapine (REMERON) 30 MG tablet   Oral   Take 60 mg by mouth at bedtime.         . Misc Natural Products (GLUCOS-CHONDROIT-MSM COMPLEX) TABS   Oral   Take 2 tablets by mouth 2 (two)  times daily.         Marland Kitchen oxyCODONE (ROXICODONE) 5 MG immediate release tablet   Oral   Take 1-2 tablets (5-10 mg total) by mouth every 4 (four) hours as needed for severe pain.   30 tablet   0   . promethazine (PHENERGAN) 25 MG tablet   Oral   Take 25 mg by mouth as needed for nausea or vomiting.           Allergies Adhesive  History reviewed. No pertinent family history.  Social History Social History  Substance Use Topics  . Smoking status: Current Every Day Smoker -- 0.75 packs/day for 28 years  . Smokeless tobacco: None  . Alcohol Use: No    Review of Systems Constitutional: Negative for fever. Cardiovascular: Negative for chest pain. Respiratory: Negative for shortness of breath. Gastrointestinal: Lower abdominal pain. Positive for nausea and loose stool. Negative for vomiting. Genitourinary: Negative for dysuria. Musculoskeletal: Negative for back pain. Neurological: Negative for headache 10-point ROS otherwise negative.  ____________________________________________   PHYSICAL EXAM:  VITAL  SIGNS: ED Triage Vitals  Enc Vitals Group     BP 07/21/15 0041 174/101 mmHg     Pulse Rate 07/21/15 0041 75     Resp 07/21/15 0041 17     Temp 07/21/15 0041 97.9 F (36.6 C)     Temp Source 07/21/15 0041 Oral     SpO2 07/21/15 0041 97 %     Weight 07/21/15 0041 185 lb (83.915 kg)     Height 07/21/15 0041 5\' 11"  ([id number] m)     Head Cir --      Peak Flow --      Pain Score 07/21/15 0044 3     Pain Loc --      Pain Edu? --      Excl. in [place]? --     Constitutional: Alert and oriented. Well appearing and in no distress. Eyes: Normal exam ENT   Head: Normocephalic and atraumatic   Mouth/Throat: Mucous membranes are moist. Cardiovascular: Normal rate, regular rhythm. No murmur Respiratory: Normal respiratory effort without tachypnea nor retractions. Breath sounds are clear Gastrointestinal: Soft and nontender. No distention. Musculoskeletal: Nontender with normal range of motion in all extremities.  Neurologic:  Normal speech and language. No gross focal neurologic deficits Skin:  Skin is warm, dry and intact.  Psychiatric: Mood and affect are normal.   ____________________________________________    EKG  EKG reviewed and interpreted by myself shows normal sinus rhythm at 74 bpm, narrow QRS, normal axis, normal intervals, nonspecific ST changes.  ____________________________________________    RADIOLOGY  CT consistent with enteritis.  ____________________________________________    INITIAL IMPRESSION / ASSESSMENT AND PLAN / ED COURSE  Pertinent labs & imaging results that were available during my care of the patient were reviewed by me and considered in my medical decision making (see chart for details).  The patient presents the emergency department with intermittent lower abdominal pain. Currently the patient has a nontender physical examination. We will check labs to proceed with a CT abdomen/pelvis to further evaluate.  CT most consistent with enteritis.  We'll place the patient on Cipro/Flagyl as well as pain medication. I discussed very strict return precautions for any worsening pain, vomiting or fever. Patient agreeable to plan. He'll follow-up with his primary care physician.  ____________________________________________   FINAL CLINICAL IMPRESSION(S) / ED DIAGNOSES  Abdominal pain Enteritis  Harvest Dark, MD 07/21/15 [phone]

## 2015-11-18 ENCOUNTER — Ambulatory Visit: Payer: Medicare Other | Admitting: Podiatry

## 2019-03-01 ENCOUNTER — Observation Stay
Admission: EM | Admit: 2019-03-01 | Discharge: 2019-03-02 | Disposition: A | Discharge status: Home / Self Care | Payer: Medicare Other | Attending: Internal Medicine | Admitting: Internal Medicine

## 2019-03-01 ENCOUNTER — Other Ambulatory Visit: Payer: Self-pay

## 2019-03-01 ENCOUNTER — Encounter: Payer: Self-pay | Admitting: Intensive Care

## 2019-03-01 DIAGNOSIS — G8929 Other chronic pain: Secondary | ICD-10-CM | POA: Diagnosis not present

## 2019-03-01 DIAGNOSIS — R42 Dizziness and giddiness: Secondary | ICD-10-CM | POA: Diagnosis not present

## 2019-03-01 DIAGNOSIS — E785 Hyperlipidemia, unspecified: Secondary | ICD-10-CM | POA: Insufficient documentation

## 2019-03-01 DIAGNOSIS — Z79899 Other long term (current) drug therapy: Secondary | ICD-10-CM | POA: Diagnosis not present

## 2019-03-01 DIAGNOSIS — Z951 Presence of aortocoronary bypass graft: Secondary | ICD-10-CM | POA: Diagnosis not present

## 2019-03-01 DIAGNOSIS — R040 Epistaxis: Principal | ICD-10-CM | POA: Insufficient documentation

## 2019-03-01 DIAGNOSIS — Z79891 Long term (current) use of opiate analgesic: Secondary | ICD-10-CM | POA: Insufficient documentation

## 2019-03-01 DIAGNOSIS — R531 Weakness: Secondary | ICD-10-CM | POA: Insufficient documentation

## 2019-03-01 DIAGNOSIS — F17210 Nicotine dependence, cigarettes, uncomplicated: Secondary | ICD-10-CM | POA: Insufficient documentation

## 2019-03-01 DIAGNOSIS — M5136 Other intervertebral disc degeneration, lumbar region: Secondary | ICD-10-CM | POA: Insufficient documentation

## 2019-03-01 DIAGNOSIS — Z888 Allergy status to other drugs, medicaments and biological substances status: Secondary | ICD-10-CM | POA: Insufficient documentation

## 2019-03-01 DIAGNOSIS — M47816 Spondylosis without myelopathy or radiculopathy, lumbar region: Secondary | ICD-10-CM | POA: Diagnosis not present

## 2019-03-01 DIAGNOSIS — F329 Major depressive disorder, single episode, unspecified: Secondary | ICD-10-CM | POA: Insufficient documentation

## 2019-03-01 DIAGNOSIS — F419 Anxiety disorder, unspecified: Secondary | ICD-10-CM | POA: Insufficient documentation

## 2019-03-01 DIAGNOSIS — Z7982 Long term (current) use of aspirin: Secondary | ICD-10-CM | POA: Diagnosis not present

## 2019-03-01 DIAGNOSIS — Z20828 Contact with and (suspected) exposure to other viral communicable diseases: Secondary | ICD-10-CM | POA: Diagnosis not present

## 2019-03-01 DIAGNOSIS — I252 Old myocardial infarction: Secondary | ICD-10-CM | POA: Insufficient documentation

## 2019-03-01 DIAGNOSIS — F32A Depression, unspecified: Secondary | ICD-10-CM | POA: Diagnosis present

## 2019-03-01 DIAGNOSIS — I2510 Atherosclerotic heart disease of native coronary artery without angina pectoris: Secondary | ICD-10-CM

## 2019-03-01 DIAGNOSIS — Z23 Encounter for immunization: Secondary | ICD-10-CM | POA: Diagnosis not present

## 2019-03-01 DIAGNOSIS — R55 Syncope and collapse: Secondary | ICD-10-CM | POA: Insufficient documentation

## 2019-03-01 DIAGNOSIS — I10 Essential (primary) hypertension: Secondary | ICD-10-CM | POA: Diagnosis present

## 2019-03-01 HISTORY — DX: Essential (primary) hypertension: I10

## 2019-03-01 LAB — COMPREHENSIVE METABOLIC PANEL
ALT: 22 U/L (ref 0–44)
AST: 18 U/L (ref 15–41)
Albumin: 3.8 g/dL (ref 3.5–5.0)
Alkaline Phosphatase: 70 U/L (ref 38–126)
Anion gap: 7 (ref 5–15)
BUN: 22 mg/dL (ref 8–23)
CO2: 23 mmol/L (ref 22–32)
Calcium: 8.5 mg/dL — ABNORMAL LOW (ref 8.9–10.3)
Chloride: 105 mmol/L (ref 98–111)
Creatinine, Ser: 1.12 mg/dL (ref 0.61–1.24)
GFR calc Af Amer: >60 mL/min (ref >60)
GFR calc non Af Amer: >60 mL/min (ref >60)
Glucose, Bld: 122 mg/dL — ABNORMAL HIGH (ref 70–99)
Potassium: 4 mmol/L (ref 3.5–5.1)
Sodium: 135 mmol/L (ref 135–145)
Total Bilirubin: 0.5 mg/dL (ref 0.3–1.2)
Total Protein: 6.5 g/dL (ref 6.5–8.1)

## 2019-03-01 LAB — CBC WITH DIFFERENTIAL/PLATELET
Abs Immature Granulocytes: 0.02 10*3/uL (ref 0.00–0.07)
Basophils Absolute: 0 10*3/uL (ref 0.0–0.1)
Basophils Relative: 0 %
Eosinophils Absolute: 0.2 10*3/uL (ref 0.0–0.5)
Eosinophils Relative: 2 %
HCT: 39.1 % (ref 39.0–52.0)
Hemoglobin: 13.3 g/dL (ref 13.0–17.0)
Immature Granulocytes: 0 %
Lymphocytes Relative: 16 %
Lymphs Abs: 1.2 10*3/uL (ref 0.7–4.0)
MCH: 29.6 pg (ref 26.0–34.0)
MCHC: 34 g/dL (ref 30.0–36.0)
MCV: 87.1 fL (ref 80.0–100.0)
Monocytes Absolute: 0.5 10*3/uL (ref 0.1–1.0)
Monocytes Relative: 7 %
Neutro Abs: 5.4 10*3/uL (ref 1.7–7.7)
Neutrophils Relative %: 75 %
Platelets: 147 10*3/uL — ABNORMAL LOW (ref 150–400)
RBC: 4.49 MIL/uL (ref 4.22–5.81)
RDW: 15 % (ref 11.5–15.5)
WBC: 7.3 10*3/uL (ref 4.0–10.5)
nRBC: 0 % (ref 0.0–0.2)

## 2019-03-01 LAB — CBC
HCT: 35.8 % — ABNORMAL LOW (ref 39.0–52.0)
Hemoglobin: 11.9 g/dL — ABNORMAL LOW (ref 13.0–17.0)
MCH: 29.5 pg (ref 26.0–34.0)
MCHC: 33.2 g/dL (ref 30.0–36.0)
MCV: 88.6 fL (ref 80.0–100.0)
Platelets: 140 10*3/uL — ABNORMAL LOW (ref 150–400)
RBC: 4.04 MIL/uL — ABNORMAL LOW (ref 4.22–5.81)
RDW: 15 % (ref 11.5–15.5)
WBC: 9.7 10*3/uL (ref 4.0–10.5)
nRBC: 0 % (ref 0.0–0.2)

## 2019-03-01 LAB — RESPIRATORY PANEL BY RT PCR (FLU A&B, COVID)
Influenza A by PCR: NEGATIVE
Influenza B by PCR: NEGATIVE
SARS Coronavirus 2 by RT PCR: NEGATIVE

## 2019-03-01 LAB — PROTIME-INR
INR: 1 (ref 0.8–1.2)
Prothrombin Time: 12.8 seconds (ref 11.4–15.2)

## 2019-03-01 MED ORDER — SODIUM CHLORIDE 0.9 % IV BOLUS
1000.0000 mL | Freq: Once | INTRAVENOUS | Status: AC
  Administered 2019-03-01: 1000 mL via INTRAVENOUS

## 2019-03-01 MED ORDER — NICOTINE 14 MG/24HR TD PT24
14.0000 mg | MEDICATED_PATCH | Freq: Once | TRANSDERMAL | Status: DC
  Administered 2019-03-01: 14 mg via TRANSDERMAL
  Filled 2019-03-01: qty 1

## 2019-03-01 MED ORDER — ACETAMINOPHEN 325 MG PO TABS: 650.0000 mg | ORAL_TABLET | Freq: Four times a day (QID) | ORAL | Status: DC | PRN

## 2019-03-01 MED ORDER — PHENYLEPHRINE HCL 10 % OP SOLN
Freq: Once | OPHTHALMIC | Status: AC
  Filled 2019-03-01: qty 10

## 2019-03-01 MED ORDER — HYDROCODONE-ACETAMINOPHEN 5-325 MG PO TABS
1.0000 | ORAL_TABLET | ORAL | Status: DC | PRN
  Administered 2019-03-01: 1 via ORAL
  Administered 2019-03-02: 2 via ORAL
  Filled 2019-03-01: qty 2
  Filled 2019-03-01: qty 1

## 2019-03-01 MED ORDER — LISINOPRIL 20 MG PO TABS
30.0000 mg | ORAL_TABLET | Freq: Every day | ORAL | Status: DC
  Administered 2019-03-01 – 2019-03-02 (×2): 30 mg via ORAL
  Filled 2019-03-01 (×2): qty 1

## 2019-03-01 MED ORDER — METOPROLOL TARTRATE 25 MG PO TABS
12.5000 mg | ORAL_TABLET | Freq: Two times a day (BID) | ORAL | Status: DC
  Administered 2019-03-01 – 2019-03-02 (×2): 12.5 mg via ORAL
  Filled 2019-03-01 (×2): qty 1

## 2019-03-01 MED ORDER — ONDANSETRON HCL 4 MG/2ML IJ SOLN
4.0000 mg | Freq: Once | INTRAMUSCULAR | Status: AC
  Administered 2019-03-01: 4 mg via INTRAVENOUS
  Filled 2019-03-01: qty 2

## 2019-03-01 MED ORDER — OXYCODONE HCL 5 MG PO TABS
5.0000 mg | ORAL_TABLET | ORAL | Status: DC | PRN
  Administered 2019-03-02: 10 mg via ORAL
  Filled 2019-03-01: qty 2

## 2019-03-01 MED ORDER — SODIUM CHLORIDE 0.9 % IV SOLN: INTRAVENOUS | Status: AC

## 2019-03-01 MED ORDER — ATORVASTATIN CALCIUM 20 MG PO TABS
40.0000 mg | ORAL_TABLET | Freq: Two times a day (BID) | ORAL | Status: DC
  Administered 2019-03-01 – 2019-03-02 (×2): 40 mg via ORAL
  Filled 2019-03-01 (×2): qty 2

## 2019-03-01 MED ORDER — ASPIRIN 81 MG PO CHEW
81.0000 mg | CHEWABLE_TABLET | Freq: Every day | ORAL | Status: DC
  Administered 2019-03-01 – 2019-03-02 (×2): 81 mg via ORAL
  Filled 2019-03-01 (×2): qty 1

## 2019-03-01 MED ORDER — BUSPIRONE HCL 15 MG PO TABS
15.0000 mg | ORAL_TABLET | Freq: Two times a day (BID) | ORAL | Status: DC
  Administered 2019-03-01 – 2019-03-02 (×2): 15 mg via ORAL
  Filled 2019-03-01 (×3): qty 1

## 2019-03-01 MED ORDER — ACETAMINOPHEN 650 MG RE SUPP: 650.0000 mg | Freq: Four times a day (QID) | RECTAL | Status: DC | PRN

## 2019-03-01 MED ORDER — ONDANSETRON HCL 4 MG PO TABS: 4.0000 mg | ORAL_TABLET | Freq: Four times a day (QID) | ORAL | Status: DC | PRN

## 2019-03-01 MED ORDER — LORAZEPAM 2 MG/ML IJ SOLN
1.0000 mg | Freq: Once | INTRAMUSCULAR | Status: AC
  Administered 2019-03-01: 1 mg via INTRAVENOUS
  Filled 2019-03-01: qty 1

## 2019-03-01 MED ORDER — ONDANSETRON HCL 4 MG/2ML IJ SOLN
4.0000 mg | Freq: Four times a day (QID) | INTRAMUSCULAR | Status: DC | PRN
  Administered 2019-03-02: 4 mg via INTRAVENOUS
  Filled 2019-03-01: qty 2

## 2019-03-01 MED ORDER — AMOXICILLIN-POT CLAVULANATE 875-125 MG PO TABS
1.0000 | ORAL_TABLET | Freq: Two times a day (BID) | ORAL | Status: DC
  Administered 2019-03-01 – 2019-03-02 (×2): 1 via ORAL
  Filled 2019-03-01 (×2): qty 1

## 2019-03-01 MED ORDER — INFLUENZA VAC A&B SA ADJ QUAD 0.5 ML IM PRSY
0.5000 mL | PREFILLED_SYRINGE | INTRAMUSCULAR | Status: DC
  Filled 2019-03-01: qty 0.5

## 2019-03-01 NOTE — ED Triage Notes (Signed)
Patient presents from home with nose bleed that started around 2:45pm today. Had nose bleed Tuesday night at home and it stopped on its own. Patient reports taking aspirin daily

## 2019-03-01 NOTE — ED Notes (Signed)
Myah, NT completing orthostatic vitals.

## 2019-03-01 NOTE — H&P (Signed)
[name] [name] [id number] DOB: 03-01-1941 DOA: 03/01/2019     PCP: Teodoro Spray, MD   Outpatient Specialists:   NONE    Patient arrived to ER on 03/01/19 at 1519  Patient coming from: home Lives alone,     Chief Complaint:   Chief Complaint  Patient presents with  . Epistaxis    HPI: Corell Orloff is a 78 y.o. 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  He also had an episode yesterday but was able to stop spontaneously.  Patient is on daily aspirin.  Feels that his epistaxis today was spontaneous. He reported feeling very weak and dizzy.  No chest pain or shortness of breath.  He reported some nausea but no vomiting no diarrhea. Patient has chronic back pain for which she takes oxycodone  He was bleeding for over 2h.   Infectious risk factors:  Reports none    In  ER RAPID COVID TEST    in house   NEGATIVE   Lab Results  Component Value Date   [place] NEGATIVE 03/01/2019     Regarding pertinent Chronic problems:     Hyperlipidemia -  on statins Lipitor   HTN on lisinopril Lopressor     CAD  - On Aspirin, statin, betablocker                 While in ER: Was able to stop bleeding  Rhino rocket was used Aungmentin for 1 week ENT follow up in 1 wk   The following Work up has been ordered so far:  Orders Placed This Encounter  Procedures  . Respiratory Panel by RT PCR (Flu A&B, Covid) - Nasopharyngeal Swab  . Comprehensive metabolic panel  . CBC with Differential  . Protime-INR  . Consult to hospitalist  ALL PATIENTS BEING ADMITTED/HAVING PROCEDURES NEED COVID-19 SCREENING  . EPISTAXIS MANAGEMENT     Following Medications were ordered in ER: Medications  nicotine (NICODERM CQ - dosed in mg/24 hours) patch 14 mg (14 mg Transdermal Patch Applied 03/01/19 1826)  lidocaine  4% (6mL) with phenylEPHRINE 10% (0.17mL) topical solution ( Topical Given 03/01/19 1701)  sodium chloride 0.9 % bolus 1,000 mL (1,000 mLs Intravenous New Bag/Given 03/01/19 1659)  ondansetron (ZOFRAN) injection 4 mg (4 mg Intravenous Given 03/01/19 1700)  LORazepam (ATIVAN) injection 1 mg (1 mg Intravenous Given 03/01/19 1827)        Consult Orders  (From admission, onward)         Start     Ordered   03/01/19 1822  Consult to hospitalist  ALL PATIENTS BEING ADMITTED/HAVING PROCEDURES NEED COVID-19 SCREENING  Once    Comments: ALL PATIENTS BEING ADMITTED/HAVING PROCEDURES NEED COVID-19 SCREENING  Provider:  (Not yet assigned)  Question Answer Comment  Place call to: hospitalist   Reason for Consult Admit   Diagnosis/Clinical Info for Consult: significant epistaxis, weakness. Labs stable, patient lost >250 ml of blood in ED prior to cessation. Too weak to stand currently, lives alone and typically active/mobile      03/01/19 1824           Significant initial  Findings: Abnormal Labs Reviewed  COMPREHENSIVE METABOLIC PANEL - Abnormal; Notable for the following components:      Result Value   Glucose, Bld 122 (*)    Calcium 8.5 (*)  All other components within normal limits  CBC WITH DIFFERENTIAL/PLATELET - Abnormal; Notable for the following components:   Platelets 147 (*)    All other components within normal limits     Otherwise labs showing:    Recent Labs  Lab 03/01/19 1658  NA 135  K 4.0  CO2 23  GLUCOSE 122*  BUN 22  CREATININE 1.12  CALCIUM 8.5*    Cr    stable,    Lab Results  Component Value Date   CREATININE 1.12 03/01/2019   CREATININE 0.96 07/21/2015   CREATININE 1.04 05/07/2013    Recent Labs  Lab 03/01/19 1658  AST 18  ALT 22  ALKPHOS 70  BILITOT 0.5  PROT 6.5  ALBUMIN 3.8   Lab Results  Component Value Date   CALCIUM 8.5 (L) 03/01/2019     WBC      Component Value Date/Time   WBC 7.3 03/01/2019 1658   ANC      Component Value Date/Time   NEUTROABS 5.4 03/01/2019 1658   NEUTROABS 4.6 05/07/2013 1522   ALC No components found for: LYMPHAB    Plt: Lab Results  Component Value Date   PLT 147 (L) 03/01/2019       COVID-19 Labs  No results for input(s): DDIMER, FERRITIN, LDH, CRP in the last 72 hours.  Lab Results  Component Value Date   [place] NEGATIVE 03/01/2019      HG/HCT  stable,      Component Value Date/Time   HGB 13.3 03/01/2019 1658   HGB 13.0 05/07/2013 1522   HCT 39.1 03/01/2019 1658   HCT 39.9 (L) 05/07/2013 1522     ECG: not Ordered     UA  not ordered       ED Triage Vitals  Enc Vitals Group     BP 03/01/19 1532 (!) 155/89     Pulse Rate 03/01/19 1532 82     Resp 03/01/19 1532 18     Temp 03/01/19 1532 99 F (37.2 C)     Temp Source 03/01/19 1532 Oral     SpO2 03/01/19 1532 100 %     Weight 03/01/19 1533 185 lb (83.9 kg)     Height 03/01/19 1533 5' 11.5" (1.816 m)     Head Circumference --      Peak Flow --      Pain Score 03/01/19 1533 2     Pain Loc --      Pain Edu? --      Excl. in [place]? --   TMAX(24)@       Latest  Blood pressure (!) 155/89, pulse 82, temperature 99 F (37.2 C), temperature source Oral, resp. rate 18, height 5' 11.5" (1.816 m), weight 83.9 kg, SpO2 100 %.     Hospitalist was called for admission for epistaxis, presyncope   Review of Systems:    Pertinent positives include: fatigue,  Constitutional:  No weight loss, night sweats, Fevers, chills,  weight loss  HEENT:  No headaches, Difficulty swallowing,Tooth/dental problems,Sore throat,  No sneezing, itching, ear ache, nasal congestion, post nasal drip,  Cardio-vascular:  No chest pain, Orthopnea, PND, anasarca, dizziness, palpitations.no Bilateral lower extremity swelling  GI:  No heartburn, indigestion, abdominal pain, nausea, vomiting, diarrhea, change in bowel habits, loss of appetite, melena, blood in stool, hematemesis Resp:  no shortness of breath at  rest. No dyspnea on exertion, No excess mucus, no productive cough, No non-productive cough, No coughing up of blood.No change in color  of mucus.No wheezing. Skin:  no rash or lesions. No jaundice GU:  no dysuria, change in color of urine, no urgency or frequency. No straining to urinate.  No flank pain.  Musculoskeletal:  No joint pain or no joint swelling. No decreased range of motion. No back pain.  Psych:  No change in mood or affect. No depression or anxiety. No memory loss.  Neuro: no localizing neurological complaints, no tingling, no weakness, no double vision, no gait abnormality, no slurred speech, no confusion  All systems reviewed and apart from [place] all are negative  Past Medical History:   Past Medical History:  Diagnosis Date  . Anxiety   . Arthritis    right hand, lower back  . Coronary artery disease   . Degenerative disc disease, lumbar   . Depression   . Hyperlipidemia   . Hypertension   . Lumbar paraspinal muscle spasm   . Lumbar spondylosis   . Myocardial infarction [facility]) 2014   NSTEMI  . Phlebitis of leg, left    s/p vein harvest for by-pass graft  . Radiculitis, lumbosacral   . Vitamin D deficiency       Past Surgical History:  Procedure Laterality Date  . CORONARY ARTERY BYPASS GRAFT  2014    4 vessel  . CYST EXCISION     anal cyst  . ESOPHAGOGASTRODUODENOSCOPY    . MASS EXCISION Right 02/19/2015   Procedure: EXCISION MASS OF SOFT TISSUE RIGHT HAND;  Surgeon: Corky Mull, MD;  Location: [facility];  Service: Orthopedics;  Laterality: Right;  LOCAL PER DR POGGI  . TONSILLECTOMY     age 24    Social History:  Ambulatory   Independently       reports that he has been smoking cigarettes. He has a 21.00 pack-year smoking history. He has never used smokeless tobacco. He reports current drug use. He reports that he does not drink alcohol.     Family History:   Family History  Problem Relation Age of Onset  . Alcohol abuse Other    . Diabetes Neg Hx   . CAD Neg Hx     Allergies: Allergies  Allergen Reactions  . Adhesive [Tape] Other (See Comments)    bandaids cause petechiae when removed     Prior to Admission medications   Medication Sig Start Date End Date Taking? Authorizing Provider  aspirin 81 MG tablet Take 81 mg by mouth daily.    [provider]  atorvastatin (LIPITOR) 80 MG tablet Take 40 mg by mouth 2 (two) times daily. 1/2 of 80 mg tablet AM & PM    [provider]  busPIRone (BUSPAR) 15 MG tablet Take 15 mg by mouth 2 (two) times daily.    [provider]  diazepam (VALIUM) 5 MG tablet Take 5 mg by mouth every 6 (six) hours as needed for anxiety.    [provider]  hydrOXYzine (ATARAX/VISTARIL) 25 MG tablet Take 1 tablet by mouth daily. 07/03/15   [provider]  lisinopril (PRINIVIL,ZESTRIL) 30 MG tablet Take 30 mg by mouth daily. PM    [provider]  metoprolol tartrate (LOPRESSOR) 25 MG tablet Take 12.5 mg by mouth 2 (two) times daily.    [provider]  mirtazapine (REMERON) 30 MG tablet Take 60 mg by mouth at bedtime.    [provider]  Misc Natural Products (GLUCOS-CHONDROIT-MSM COMPLEX) TABS Take 2 tablets by mouth 2 (two) times daily.    [provider]  ondansetron (ZOFRAN ODT) 4 MG disintegrating tablet Take 1 tablet (4 mg total) by mouth every 8 (eight) hours as needed for nausea or vomiting. 07/21/15   Harvest Dark, MD  oxyCODONE (ROXICODONE) 5 MG immediate release tablet Take 1-2 tablets (5-10 mg total) by mouth every 4 (four) hours as needed for severe pain. 02/19/15   Poggi, Marshall Cork, MD  oxyCODONE-acetaminophen (ROXICET) 5-325 MG tablet Take 1 tablet by mouth every 6 (six) hours as needed. 07/21/15   Harvest Dark, MD  promethazine (PHENERGAN) 25 MG tablet Take 25 mg by mouth as needed for nausea or vomiting.    [provider]   Physical Exam: Blood pressure (!) 155/89, pulse 82,  temperature 99 F (37.2 C), temperature source Oral, resp. rate 18, height 5' 11.5" (1.816 m), weight 83.9 kg, SpO2 100 %. 1. General:  in No  Acute distress   acutely ill -appearing 2. Psychological: Alert and   Oriented 3. Head/ENT:     Dry Mucous Membranes                          Head Non traumatic, neck supple                            Poor Dentition                           Dry blood around nares                                  Rhino Rocket in place 4. SKIN:  decreased Skin turgor,  Skin clean Dry and intact no rash 5. Heart: Regular rate and rhythm no  Murmur, no Rub or gallop 6. Lungs:  Clear to auscultation bilaterally, no wheezes or crackles   7. Abdomen: Soft,  non-tender, Non distended  bowel sounds present 8. Lower extremities: no clubbing, cyanosis, no  edema 9. Neurologically Grossly intact, moving all 4 extremities equally   10. MSK: Normal range of motion   All other LABS:     Recent Labs  Lab 03/01/19 1658  WBC 7.3  NEUTROABS 5.4  HGB 13.3  HCT 39.1  MCV 87.1  PLT 147*     Recent Labs  Lab 03/01/19 1658  NA 135  K 4.0  CL 105  CO2 23  GLUCOSE 122*  BUN 22  CREATININE 1.12  CALCIUM 8.5*     Recent Labs  Lab 03/01/19 1658  AST 18  ALT 22  ALKPHOS 70  BILITOT 0.5  PROT 6.5  ALBUMIN 3.8    Cultures: No results found for: SDES, SPECREQUEST, CULT, REPTSTATUS   Radiological Exams on Admission: No results found.  Chart has been reviewed   Assessment/Plan  78 y.o. 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 Epistaxis - plant to keep packing in for 1 week will need follow up with ENT at that time to have it removed, continue on Augmentin  If recurs will need ENT consult  . CAD (coronary artery disease) -  - chronic, hold  Aspirin but continue  Statin  and beta blocker   .  Depression - stable  chronic  . Essential hypertension - continue home meds   Tobacco abuse -  - Spoke about importance of quitting spent 5 minutes discussing options for treatment, prior attempts at quitting, and dangers of smoking  -At this point patient is   NOT  interested in quitting  - order nicotine patch    Presyncope in the setting of blood loss - will obtain repeat CBC and orthostatics observe on tele Transfuse if needed No chest pain no shortness of breath Other plan as per orders.  DVT prophylaxis:  SCD    Code Status:  FULL CODE  as per patient    I had personally discussed CODE STATUS with patient    Family Communication:   Family not at  Bedside    Disposition Plan:       To home once workup is complete and patient is stable                   Would benefit from PT/OT eval prior to [place] called: none    Admission status:  ED Disposition    None      Obs     Level of care    tele  For 12H  please discontinue once patient no longer qualifies   Precautions: admitted as   Covid Negative  No active isolations    PPE: Used by the provider:   P100  eye Goggles,  Gloves      Apryle Stowell 03/01/2019, 8:35 PM    [facility]     after 2 AM please page floor coverage PA If 7AM-7PM, please contact the day team taking care of the patient using [email or web address]

## 2019-03-01 NOTE — ED Provider Notes (Signed)
[facility] Emergency Department Provider Note  ____________________________________________  Time seen: Approximately 3:53 PM  I have reviewed the triage vital signs and the nursing notes.   HISTORY  Chief Complaint Epistaxis    HPI [name] [name] is a 78 y.o. male who presents the emergency department for evaluation of ongoing epistaxis.  Patient states that 2 nights ago he had a period of epistaxis lasting approximately 30 minutes but was able to control with direct pressure.  Patient states that he had spontaneous return of epistaxis today and states that he is not been able to control the bleeding.  Patient states that if he is not leaning forward with a nose clamp on he just has postnasal drainage of the blood.  Patient states that he feels weak and dizzy at this time but denies any headache, vision changes, chest pain, shortness of breath, abdominal pain.  Patient states that he feels nauseated but no emesis.  No diarrhea or constipation.  No recent exposures to anyone else that has been sick.  No history of recurrent epistaxis other than 2 days ago.  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  No complaints of chronic medical problems at this time.         Past Medical History:  Diagnosis Date  . Anxiety   . Arthritis    right hand, lower back  . Coronary artery disease   . Degenerative disc disease, lumbar   . Depression   . Hyperlipidemia   . Hypertension   . Lumbar paraspinal muscle spasm   . Lumbar spondylosis   . Myocardial infarction [facility]) 2014   NSTEMI  . Phlebitis of leg, left    s/p vein harvest for by-pass graft  . Radiculitis, lumbosacral   . Vitamin D deficiency     Patient Active Problem List   Diagnosis Date Noted  . Epistaxis 03/01/2019  . CAD (coronary artery disease) 03/01/2019  . Depression 03/01/2019  . Essential hypertension 03/01/2019  .  Postural dizziness with presyncope 03/01/2019    Past Surgical History:  Procedure Laterality Date  . CORONARY ARTERY BYPASS GRAFT  2014    4 vessel  . CYST EXCISION     anal cyst  . ESOPHAGOGASTRODUODENOSCOPY    . MASS EXCISION Right 02/19/2015   Procedure: EXCISION MASS OF SOFT TISSUE RIGHT HAND;  Surgeon: Corky Mull, MD;  Location: [facility];  Service: Orthopedics;  Laterality: Right;  LOCAL PER DR POGGI  . TONSILLECTOMY     age 74    Prior to Admission medications   Medication Sig Start Date End Date Taking? Authorizing Provider  aspirin 81 MG tablet Take 81 mg by mouth daily.   Yes [provider]  atorvastatin (LIPITOR) 80 MG tablet Take 80 mg by mouth every evening. 1/2 of 80 mg tablet AM & PM    Yes [provider]  busPIRone (BUSPAR) 15 MG tablet Take 30 mg by mouth at bedtime.    Yes [provider]  diazepam (VALIUM) 5 MG tablet Take 5 mg by mouth every 6 (six) hours as needed for anxiety.   Yes [provider]  lisinopril (PRINIVIL,ZESTRIL) 30 MG tablet Take 30 mg by mouth every evening.    Yes [provider]  metoprolol tartrate (LOPRESSOR) 25 MG tablet Take 12.5 mg by mouth 2 (two) times daily.   Yes [provider]  mirtazapine (REMERON) 30 MG tablet Take 30  mg by mouth daily.    Yes [provider]  oxyCODONE (OXY IR/ROXICODONE) 5 MG immediate release tablet Take 5-10 mg by mouth every 4 (four) hours as needed for severe pain.   Yes [provider]    Allergies Adhesive [tape] and Duloxetine  Family History  Problem Relation Age of Onset  . Alcohol abuse Other   . Diabetes Neg Hx   . CAD Neg Hx     Social History Social History   Tobacco Use  . Smoking status: Current Every Day Smoker    Packs/day: 0.75    Years: 28.00    Pack years: 21.00    Types: Cigarettes  . Smokeless tobacco: Never Used  Substance Use Topics  . Alcohol use: No  . Drug use: Yes    Comment:  prescribed oxy for chronic back pain     Review of Systems  Constitutional: No fever/chills.  Endorses dizziness. Eyes: No visual changes. No discharge ENT: Spontaneous onset, ongoing epistaxis. Cardiovascular: no chest pain. Respiratory: no cough. No SOB. Gastrointestinal: No abdominal pain.  Positive nausea, no vomiting.  No diarrhea.  No constipation. Musculoskeletal: Negative for musculoskeletal pain. Skin: Negative for rash, abrasions, lacerations, ecchymosis. Neurological: Negative for headaches, focal weakness or numbness. 10-point ROS otherwise negative.  ____________________________________________   PHYSICAL EXAM:  VITAL SIGNS: ED Triage Vitals  Enc Vitals Group     BP 03/01/19 1532 (!) 155/89     Pulse Rate 03/01/19 1532 82     Resp 03/01/19 1532 18     Temp 03/01/19 1532 99 F (37.2 C)     Temp Source 03/01/19 1532 Oral     SpO2 03/01/19 1532 100 %     Weight 03/01/19 1533 185 lb (83.9 kg)     Height 03/01/19 1533 5' 11.5" (1.816 m)     Head Circumference --      Peak Flow --      Pain Score 03/01/19 1533 2     Pain Loc --      Pain Edu? --      Excl. in [place]? --      Constitutional: Alert and oriented. Well appearing and in no acute distress. Eyes: Conjunctivae are normal. PERRL. EOMI. Head: Atraumatic. ENT:      Ears:       Nose: No congestion/rhinnorhea.  Nasal clamp in place on my assessment. Nasal clamp in place for approximately an hour.  Removal results in immediate return of epistaxis.  Epistaxis is noted out of both nares.  Given amount of active bleeding, congealed blood in the nares, unable to determine source on physical exam.  No tenderness to palpation along the aspect of the nasal bridge.      Mouth/Throat: Mucous membranes are moist.  Evidence in the oral cavity of postnasal drip of blood.  No active/ongoing posterior drainage of blood.  No other oropharyngeal findings. Neck: No stridor.   Hematological/Lymphatic/Immunilogical: No cervical  lymphadenopathy. Cardiovascular: Normal rate, regular rhythm. Normal S1 and S2.  Good peripheral circulation. Respiratory: Normal respiratory effort without tachypnea or retractions. Lungs CTAB. Good air entry to the bases with no decreased or absent breath sounds. Musculoskeletal: Full range of motion to all extremities. No gross deformities appreciated. Neurologic:  Normal speech and language. No gross focal neurologic deficits are appreciated.  Skin:  Skin is warm, dry and intact. No rash noted. Psychiatric: Mood and affect are normal. Speech and behavior are normal. Patient exhibits appropriate insight and judgement.   ____________________________________________  LABS (all labs ordered are listed, but only abnormal results are displayed)  Labs Reviewed  COMPREHENSIVE METABOLIC PANEL - Abnormal; Notable for the following components:      Result Value   Glucose, Bld 122 (*)    Calcium 8.5 (*)    All other components within normal limits  CBC WITH DIFFERENTIAL/PLATELET - Abnormal; Notable for the following components:   Platelets 147 (*)    All other components within normal limits  RESPIRATORY PANEL BY RT PCR (FLU A&B, COVID)  PROTIME-INR  CBC  MAGNESIUM  PHOSPHORUS  TSH  COMPREHENSIVE METABOLIC PANEL  CBC  TYPE AND SCREEN   ____________________________________________  EKG   ____________________________________________  RADIOLOGY   No results found.  ____________________________________________    PROCEDURES  Procedure(s) performed:    .Epistaxis Management  Date/Time: 03/01/2019 6:00 PM Performed by: Darletta Moll, PA-C Authorized by: Darletta Moll, PA-C   Consent:    Consent obtained:  Verbal   Consent given by:  Patient   Risks discussed:  Bleeding, infection, nasal injury and pain Anesthesia (see MAR for exact dosages):    Anesthesia method:  Topical application   Topical anesthesia: lidocaine/phenylephrine. Procedure  details:    Treatment site:  R posterior   Treatment method:  Nasal tampon   Treatment episode: initial   Post-procedure details:    Assessment:  Bleeding decreased   Patient tolerance of procedure:  Tolerated well, no immediate complications Comments:     Posterior packing with Rhino Rocket.  10 mL of air placed into each balloon.  Patient tolerated well.  Patient does have a decrease in bleeding.  Will reevaluate in 15 to 20 minutes.      Medications  nicotine (NICODERM CQ - dosed in mg/24 hours) patch 14 mg (14 mg Transdermal Patch Applied 03/01/19 1826)  aspirin chewable tablet 81 mg (has no administration in time range)  oxyCODONE (Oxy IR/ROXICODONE) immediate release tablet 5-10 mg (has no administration in time range)  atorvastatin (LIPITOR) tablet 40 mg (has no administration in time range)  lisinopril (ZESTRIL) tablet 30 mg (has no administration in time range)  metoprolol tartrate (LOPRESSOR) tablet 12.5 mg (has no administration in time range)  busPIRone (BUSPAR) tablet 15 mg (has no administration in time range)  acetaminophen (TYLENOL) tablet 650 mg (has no administration in time range)    Or  acetaminophen (TYLENOL) suppository 650 mg (has no administration in time range)  HYDROcodone-acetaminophen (NORCO/VICODIN) 5-325 MG per tablet 1-2 tablet (has no administration in time range)  ondansetron (ZOFRAN) tablet 4 mg (has no administration in time range)    Or  ondansetron (ZOFRAN) injection 4 mg (has no administration in time range)  0.9 %  sodium chloride infusion (has no administration in time range)  amoxicillin-clavulanate (AUGMENTIN) 875-125 MG per tablet 1 tablet (has no administration in time range)  influenza vaccine adjuvanted (FLUAD) injection 0.5 mL (has no administration in time range)  lidocaine 4% (74mL) with phenylEPHRINE 10% (0.79mL) topical solution ( Topical Given 03/01/19 1701)  sodium chloride 0.9 % bolus 1,000 mL (0 mLs Intravenous Stopped 03/01/19  1910)  ondansetron (ZOFRAN) injection 4 mg (4 mg Intravenous Given 03/01/19 1700)  LORazepam (ATIVAN) injection 1 mg (1 mg Intravenous Given 03/01/19 1827)     ____________________________________________   INITIAL IMPRESSION / ASSESSMENT AND PLAN / ED COURSE  Pertinent labs & imaging results that were available during my care of the patient were reviewed by me and considered in my medical decision making (see chart for details).  Review of the [place] CSRS was performed in accordance of the [place] prior to dispensing any controlled drugs.  Clinical Course as of Mar 01 2143  Thu Mar 01, 2019  1803 Patient presented to the emergency department complaining of ongoing epistaxis even with nasal clamp.  Patient had an episode 2 days ago with 30 minutes of epistaxis that he states he eventually resolved with direct pressure.  Patient had return spontaneously of epistaxis today.  Patient denies any recent trauma.  He denies any URI symptoms.  Patient had ongoing active epistaxis on my initial evaluation.  Given the fact that [employer] has been in place for 1 hour without any improvement in epistaxis, patient was experiencing significant postnasal drainage of blood, immediately proceeded to packing.  Using [employer], topical lidocaine and phenylephrine mixed with topical antibiotics is inserted.  Patient had increased epistaxis with postnasal bleeding in the interim from initial evaluation to placement of Rhino Rocket.  During this period, patient was given suctioning as he had a choking sensation from the amount of blood.  Using oral suctioning, patient had 250 mL of blood captured.  At this time, patient is feeling exceptionally weak, very dizzy, has difficulty standing on his own.  Patient lives alone, is typically very mobile and active.  Initial labs reveal good blood counts of hemoglobin of 13.3, platelets of 147.  As this blood work is obtained relatively soon on the onset of bleeding, I feel repeat  labs and fluids would likely benefit the patient.  At this time I do not feel that patient is stable to be discharged alone.  We will contact the hospital service for at least overnight observation.   [JC]  1808 After [employer], patient has minimal ongoing postnasal bloody drainage at worst, and possibly good cessation of epistaxis.  I do not feel emergent intervention by ENT surgery is necessary at this time.   [JC]    Clinical Course User Index [JC] Nikola Blackston, Charline Bills, PA-C          Patient's diagnosis is consistent with epistaxis, weakness.  Patient presents emergency department complaining of uncontrolled epistaxis.  Patient had a nasal clamp placed but had active bleeding for an hour and 15 minutes prior to my assessment.  Patient had significant amount of posterior drainage of his hemorrhaging even with nasal clamp and posturally leaning forward.  Patient began gagging, coughing blood after initial assessment.  Patient was given suction catheter while I prepared for Rhino Rocket placement.  Using suction catheter, controlled by patient himself,  patient suctioned 250 mL of blood in the period of 20 to 25 minutes prior to [employer] placement.  Patient was very weak, dizzy.  Patient was unable to stand unassisted.  He lives by himself and is typically very active.  With this significant change even with reassuring labs and good cessation of bleeding I felt the patient would be best served with observation overnight.  I am concerned that patient may have a drop in his hemoglobin initially Labs.  Discussed with hospitalist service who is agreeable to observing overnight and if patient continues to do well, has improvement of his weakness he will be discharged in the morning.  I have advised the patient Rhino Rocket will stay in for the next week and it should be removed by ENT surgery.  Discussed with the hospitalist placement of patient on antibiotics during this course as well.  Patient is  stable at this time with no ongoing epistaxis.  Patient care was transferred to hospital service at this time.    ____________________________________________  FINAL CLINICAL IMPRESSION(S) / ED DIAGNOSES  Final diagnoses:  Acute posterior epistaxis  Weakness  Dizziness      NEW MEDICATIONS STARTED DURING THIS VISIT:  ED Discharge Orders    None          This chart was dictated using voice recognition software/Dragon. Despite best efforts to proofread, errors can occur which can change the meaning. Any change was purely unintentional.    Darletta Moll, PA-C 03/01/19 2144    Arta Silence, MD 03/01/19 [phone]

## 2019-03-02 DIAGNOSIS — R079 Chest pain, unspecified: Secondary | ICD-10-CM

## 2019-03-02 DIAGNOSIS — F329 Major depressive disorder, single episode, unspecified: Secondary | ICD-10-CM | POA: Diagnosis not present

## 2019-03-02 DIAGNOSIS — R040 Epistaxis: Secondary | ICD-10-CM | POA: Diagnosis not present

## 2019-03-02 DIAGNOSIS — I2510 Atherosclerotic heart disease of native coronary artery without angina pectoris: Secondary | ICD-10-CM | POA: Diagnosis not present

## 2019-03-02 LAB — TYPE AND SCREEN
ABO/RH(D): O POS
Antibody Screen: NEGATIVE

## 2019-03-02 LAB — CBC
HCT: 33.5 % — ABNORMAL LOW (ref 39.0–52.0)
Hemoglobin: 11.4 g/dL — ABNORMAL LOW (ref 13.0–17.0)
MCH: 29.8 pg (ref 26.0–34.0)
MCHC: 34 g/dL (ref 30.0–36.0)
MCV: 87.7 fL (ref 80.0–100.0)
Platelets: 126 10*3/uL — ABNORMAL LOW (ref 150–400)
RBC: 3.82 MIL/uL — ABNORMAL LOW (ref 4.22–5.81)
RDW: 15.2 % (ref 11.5–15.5)
WBC: 7.5 10*3/uL (ref 4.0–10.5)
nRBC: 0 % (ref 0.0–0.2)

## 2019-03-02 LAB — COMPREHENSIVE METABOLIC PANEL
ALT: 20 U/L (ref 0–44)
AST: 17 U/L (ref 15–41)
Albumin: 3.3 g/dL — ABNORMAL LOW (ref 3.5–5.0)
Alkaline Phosphatase: 55 U/L (ref 38–126)
Anion gap: 9 (ref 5–15)
BUN: 36 mg/dL — ABNORMAL HIGH (ref 8–23)
CO2: 21 mmol/L — ABNORMAL LOW (ref 22–32)
Calcium: 8.4 mg/dL — ABNORMAL LOW (ref 8.9–10.3)
Chloride: 107 mmol/L (ref 98–111)
Creatinine, Ser: 1.09 mg/dL (ref 0.61–1.24)
GFR calc Af Amer: >60 mL/min (ref >60)
GFR calc non Af Amer: >60 mL/min (ref >60)
Glucose, Bld: 93 mg/dL (ref 70–99)
Potassium: 4.2 mmol/L (ref 3.5–5.1)
Sodium: 137 mmol/L (ref 135–145)
Total Bilirubin: 0.7 mg/dL (ref 0.3–1.2)
Total Protein: 5.7 g/dL — ABNORMAL LOW (ref 6.5–8.1)

## 2019-03-02 LAB — PHOSPHORUS: Phosphorus: 2.6 mg/dL (ref 2.5–4.6)

## 2019-03-02 LAB — TSH: TSH: 2.364 u[IU]/mL (ref 0.350–4.500)

## 2019-03-02 LAB — MAGNESIUM: Magnesium: 1.7 mg/dL (ref 1.7–2.4)

## 2019-03-02 MED ORDER — AMOXICILLIN-POT CLAVULANATE 875-125 MG PO TABS: 1.0000 | ORAL_TABLET | Freq: Two times a day (BID) | ORAL | 0 refills | Status: DC

## 2019-03-02 NOTE — Discharge Summary (Signed)
[name] [name] [id number] DOB: [birth year] DOA: 03/01/2019  PCP: Teodoro Spray, MD  Admit date: 03/01/2019 Discharge date: 03/02/2019  Admitted From: Home Disposition:  Home  Recommendations for Outpatient Follow-up:  1. Follow up with PCP in 1 week 2. Please obtain BMP/CBC in one week 3. Please follow up on the following pending results:none 4. F/u with ENT next week  Home Health:none    Discharge Condition:Stable CODE STATUS:full  Diet recommendation: Heart Healthy  Brief/Interim Summary: [name] [name] is a 78 y.o. 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  He also had an episode yesterday but was able to stop spontaneously.  Patient is on daily aspirin.  He reported feeling very weak and dizzy.  No chest pain or shortness of breath.  He reported some nausea but no vomiting no diarrhea.Aspirin was held. H/H remained stable, did not require transfusion. He was negative for orthostatics. His symptoms resolved. No further epistaxis after he got packed in the ED. He was instructed to call ENT and follow up in couple of days for the [facility] to be removed by ENT. and to hold aspirin until he sees them on Monday. He was started on Augmentin during the course until he f/u with ENT.  Patient was also counseled on smoking cessation but he is not interested in quitting.  Discharge Diagnoses:  Active Problems:   Epistaxis   CAD (coronary artery disease)   Depression   Essential hypertension   Postural dizziness with presyncope    Discharge Instructions  Discharge Instructions    Call MD for:  temperature >100.4   Complete by: As directed    Diet - low sodium heart healthy   Complete by: As directed    Discharge instructions   Complete by: As directed    Call your ENT doctor for unpacking. Hold aspirin until you see the.    Increase activity slowly   Complete by: As directed      Allergies as of 03/02/2019      Reactions   Adhesive [tape] Other (See Comments)   bandaids cause petechiae when removed   Duloxetine Other (See Comments)   Urinary retention      Medication List    STOP taking these medications   aspirin 81 MG tablet     TAKE these medications   amoxicillin-clavulanate 875-125 MG tablet Commonly known as: AUGMENTIN Take 1 tablet by mouth 2 (two) times daily.   atorvastatin 80 MG tablet Commonly known as: LIPITOR Take 80 mg by mouth every evening. 1/2 of 80 mg tablet AM & PM   busPIRone 15 MG tablet Commonly known as: BUSPAR Take 30 mg by mouth at bedtime.   diazepam 5 MG tablet Commonly known as: VALIUM Take 5 mg by mouth every 6 (six) hours as needed for anxiety.   lisinopril 30 MG tablet Commonly known as: ZESTRIL Take 30 mg by mouth every evening.   metoprolol tartrate 25 MG tablet Commonly known as: LOPRESSOR Take 12.5 mg by mouth 2 (two) times daily.   mirtazapine 30 MG tablet Commonly known as: REMERON Take 30 mg by mouth daily.   oxyCODONE 5 MG immediate release tablet Commonly known as: Oxy IR/ROXICODONE Take 5-10 mg by mouth every 4 (four) hours as needed for severe pain.       Allergies  Allergen Reactions  . Adhesive [Tape] Other (See Comments)  bandaids cause petechiae when removed  . Duloxetine Other (See Comments)    Urinary retention    Consultations: none  Procedures/Studies: No results found.    Subjective: Has no dizziness, sob, cp. Excited to go home  Discharge Exam: Vitals:   03/02/19 0852 03/02/19 1020  BP: (!) 155/76 140/77  Pulse: 73 68  Resp:    Temp:    SpO2: 95% 100%   Vitals:   03/02/19 0849 03/02/19 0851 03/02/19 0852 03/02/19 1020  BP: (!) 148/69 (!) 144/76 (!) 155/76 140/77  Pulse: 70 74 73 68  Resp:      Temp:      TempSrc:      SpO2: 97% 98% 95% 100%  Weight:      Height:        General: Pt is  alert, awake, not in acute distress ENT: Nostril packing in place.  Cardiovascular: RRR, S1/S2 +, no rubs, no gallops Respiratory: CTA bilaterally, no wheezing, no rhonchi Abdominal: Soft, NT, ND, bowel sounds + Extremities: no edema, no cyanosis    The results of significant diagnostics from this hospitalization (including imaging, microbiology, ancillary and laboratory) are listed below for reference.     Microbiology: Recent Results (from the past 240 hour(s))  Respiratory Panel by RT PCR (Flu A&B, Covid) - Nasopharyngeal Swab     Status: None   Collection Time: 03/01/19  5:47 PM   Specimen: Nasopharyngeal Swab  Result Value Ref Range Status   SARS Coronavirus 2 by RT PCR NEGATIVE NEGATIVE Final    Comment: (NOTE) SARS-CoV-2 target nucleic acids are NOT DETECTED. The SARS-CoV-2 RNA is generally detectable in upper respiratoy specimens during the acute phase of infection. The lowest concentration of SARS-CoV-2 viral copies this assay can detect is 131 copies/mL. A negative result does not preclude SARS-Cov-2 infection and should not be used as the sole basis for treatment or other patient management decisions. A negative result may occur with  improper specimen collection/handling, submission of specimen other than nasopharyngeal swab, presence of viral mutation(s) within the areas targeted by this assay, and inadequate number of viral copies (<131 copies/mL). A negative result must be combined with clinical observations, patient history, and epidemiological information. The expected result is Negative. Fact Sheet for Patients:  PinkCheek.be Fact Sheet for Healthcare Providers:  GravelBags.it This test is not yet ap proved or cleared by the [place] FDA and  has been authorized for detection and/or diagnosis of SARS-CoV-2 by FDA under an Emergency Use Authorization (EUA). This EUA will remain  in effect (meaning  this test can be used) for the duration of the COVID-19 declaration under Section 564(b)(1) of the Act, 21 U.S.C. section 360bbb-3(b)(1), unless the authorization is terminated or revoked sooner.    Influenza A by PCR NEGATIVE NEGATIVE Final   Influenza B by PCR NEGATIVE NEGATIVE Final    Comment: (NOTE) The Xpert Xpress SARS-CoV-2/FLU/RSV assay is intended as an aid in  the diagnosis of influenza from Nasopharyngeal swab specimens and  should not be used as a sole basis for treatment. Nasal washings and  aspirates are unacceptable for Xpert Xpress SARS-CoV-2/FLU/RSV  testing. Fact Sheet for Patients: PinkCheek.be Fact Sheet for Healthcare Providers: GravelBags.it This test is not yet approved or cleared by the [place] FDA and  has been authorized for detection and/or diagnosis of SARS-CoV-2 by  FDA under an Emergency Use Authorization (EUA). This EUA will remain  in effect (meaning this test can be used) for the duration of the  Covid-19 declaration under Section 564(b)(1) of the Act, 21  U.S.C. section 360bbb-3(b)(1), unless the authorization is  terminated or revoked. Performed at [facility], [address] [place], [place] [address]      Labs: BNP (last 3 results) No results for input(s): BNP in the last 8760 hours. Basic Metabolic Panel: Recent Labs  Lab 03/01/19 1658 03/02/19 0313  NA 135 137  K 4.0 4.2  CL 105 107  CO2 23 21*  GLUCOSE 122* 93  BUN 22 36*  CREATININE 1.12 1.09  CALCIUM 8.5* 8.4*  MG  --  1.7  PHOS  --  2.6   Liver Function Tests: Recent Labs  Lab 03/01/19 1658 03/02/19 0313  AST 18 17  ALT 22 20  ALKPHOS 70 55  BILITOT 0.5 0.7  PROT 6.5 5.7*  ALBUMIN 3.8 3.3*   No results for input(s): LIPASE, AMYLASE in the last 168 hours. No results for input(s): AMMONIA in the last 168 hours. CBC: Recent Labs  Lab 03/01/19 1658 03/01/19 2257 03/02/19 0313  WBC 7.3  9.7 7.5  NEUTROABS 5.4  --   --   HGB 13.3 11.9* 11.4*  HCT 39.1 35.8* 33.5*  MCV 87.1 88.6 87.7  PLT 147* 140* 126*   Cardiac Enzymes: No results for input(s): CKTOTAL, CKMB, CKMBINDEX, TROPONINI in the last 168 hours. BNP: Invalid input(s): POCBNP CBG: No results for input(s): GLUCAP in the last 168 hours. D-Dimer No results for input(s): DDIMER in the last 72 hours. Hgb A1c No results for input(s): HGBA1C in the last 72 hours. Lipid Profile No results for input(s): CHOL, HDL, LDLCALC, TRIG, CHOLHDL, LDLDIRECT in the last 72 hours. Thyroid function studies Recent Labs    03/02/19 0313  TSH 2.364   Anemia work up No results for input(s): VITAMINB12, FOLATE, FERRITIN, TIBC, IRON, RETICCTPCT in the last 72 hours. Urinalysis    Component Value Date/Time   COLORURINE STRAW (A) 07/21/2015 0232   APPEARANCEUR CLEAR (A) 07/21/2015 0232   APPEARANCEUR Clear 03/19/2013 1549   LABSPEC 1.005 07/21/2015 0232   LABSPEC 1.012 03/19/2013 1549   PHURINE 5.0 07/21/2015 0232   GLUCOSEU NEGATIVE 07/21/2015 0232   GLUCOSEU Negative 03/19/2013 1549   HGBUR 1+ (A) 07/21/2015 0232   BILIRUBINUR NEGATIVE 07/21/2015 0232   BILIRUBINUR Negative 03/19/2013 1549   KETONESUR NEGATIVE 07/21/2015 0232   PROTEINUR NEGATIVE 07/21/2015 0232   NITRITE NEGATIVE 07/21/2015 0232   LEUKOCYTESUR NEGATIVE 07/21/2015 0232   LEUKOCYTESUR Negative 03/19/2013 1549   Sepsis Labs Invalid input(s): PROCALCITONIN,  WBC,  LACTICIDVEN Microbiology Recent Results (from the past 240 hour(s))  Respiratory Panel by RT PCR (Flu A&B, Covid) - Nasopharyngeal Swab     Status: None   Collection Time: 03/01/19  5:47 PM   Specimen: Nasopharyngeal Swab  Result Value Ref Range Status   SARS Coronavirus 2 by RT PCR NEGATIVE NEGATIVE Final    Comment: (NOTE) SARS-CoV-2 target nucleic acids are NOT DETECTED. The SARS-CoV-2 RNA is generally detectable in upper respiratoy specimens during the acute phase of infection. The  lowest concentration of SARS-CoV-2 viral copies this assay can detect is 131 copies/mL. A negative result does not preclude SARS-Cov-2 infection and should not be used as the sole basis for treatment or other patient management decisions. A negative result may occur with  improper specimen collection/handling, submission of specimen other than nasopharyngeal swab, presence of viral mutation(s) within the areas targeted by this assay, and inadequate number of viral copies (<131 copies/mL). A negative result must be  combined with clinical observations, patient history, and epidemiological information. The expected result is Negative. Fact Sheet for Patients:  PinkCheek.be Fact Sheet for Healthcare Providers:  GravelBags.it This test is not yet ap proved or cleared by the [place] FDA and  has been authorized for detection and/or diagnosis of SARS-CoV-2 by FDA under an Emergency Use Authorization (EUA). This EUA will remain  in effect (meaning this test can be used) for the duration of the COVID-19 declaration under Section 564(b)(1) of the Act, 21 U.S.C. section 360bbb-3(b)(1), unless the authorization is terminated or revoked sooner.    Influenza A by PCR NEGATIVE NEGATIVE Final   Influenza B by PCR NEGATIVE NEGATIVE Final    Comment: (NOTE) The Xpert Xpress SARS-CoV-2/FLU/RSV assay is intended as an aid in  the diagnosis of influenza from Nasopharyngeal swab specimens and  should not be used as a sole basis for treatment. Nasal washings and  aspirates are unacceptable for Xpert Xpress SARS-CoV-2/FLU/RSV  testing. Fact Sheet for Patients: PinkCheek.be Fact Sheet for Healthcare Providers: GravelBags.it This test is not yet approved or cleared by the [place] FDA and  has been authorized for detection and/or diagnosis of SARS-CoV-2 by  FDA under an Emergency  Use Authorization (EUA). This EUA will remain  in effect (meaning this test can be used) for the duration of the  Covid-19 declaration under Section 564(b)(1) of the Act, 21  U.S.C. section 360bbb-3(b)(1), unless the authorization is  terminated or revoked. Performed at [facility], [address], [place], [place] [address]      Time coordinating discharge: Over 30 minutes  SIGNED:   Nolberto Hanlon, MD  [facility] 03/02/2019, 10:29 AM Pager   If 7PM-7AM, please contact night-coverage [email or web address] Password [email or web address]

## 2019-03-02 NOTE — Progress Notes (Signed)
PT Cancellation Note  Patient Details Name: [name] [name] MRN: [id number] DOB: April 10, 1940   Cancelled Treatment:    Reason Eval/Treat Not Completed: PT screened, no needs identified, will sign off;Other (comment)(Patient consult received and reviewed. Consulted with nursing who cleared patient from PT stating they are independent in room and at their baseline. Will be happy to see patient again in the future as need arises.)  Janna Arch, PT, DPT   03/02/2019, 10:24 AM

## 2019-03-02 NOTE — Progress Notes (Signed)
Berlinda Last to be D/C'd Home per MD order.  Discussed prescriptions and follow up appointments with the patient. Prescriptions were e-prescribed, medication list explained in detail. Pt verbalized understanding.  Allergies as of 03/02/2019      Reactions   Adhesive [tape] Other (See Comments)   bandaids cause petechiae when removed   Duloxetine Other (See Comments)   Urinary retention      Medication List    STOP taking these medications   aspirin 81 MG tablet     TAKE these medications   amoxicillin-clavulanate 875-125 MG tablet Commonly known as: AUGMENTIN Take 1 tablet by mouth 2 (two) times daily.   atorvastatin 80 MG tablet Commonly known as: LIPITOR Take 80 mg by mouth every evening. 1/2 of 80 mg tablet AM & PM   busPIRone 15 MG tablet Commonly known as: BUSPAR Take 30 mg by mouth at bedtime.   diazepam 5 MG tablet Commonly known as: VALIUM Take 5 mg by mouth every 6 (six) hours as needed for anxiety.   lisinopril 30 MG tablet Commonly known as: ZESTRIL Take 30 mg by mouth every evening.   metoprolol tartrate 25 MG tablet Commonly known as: LOPRESSOR Take 12.5 mg by mouth 2 (two) times daily.   mirtazapine 30 MG tablet Commonly known as: REMERON Take 30 mg by mouth daily.   oxyCODONE 5 MG immediate release tablet Commonly known as: Oxy IR/ROXICODONE Take 5-10 mg by mouth every 4 (four) hours as needed for severe pain.       Vitals:   03/02/19 0852 03/02/19 1020  BP: (!) 155/76 140/77  Pulse: 73 68  Resp:    Temp:    SpO2: 95% 100%    Tele box removed and returned. Skin clean, dry and intact without evidence of skin break down, no evidence of skin tears noted. IV catheter discontinued intact. Site without signs and symptoms of complications. Dressing and pressure applied. Pt denies pain at this time. No complaints noted.  An After Visit Summary was printed and given to the patient. Patients states does not have a ride a home and does not have  enough money to pay a taxi. Nursing supervisor notified and was able to get a taxi voucher for pt. Patient escorted via [place], and D/C home via taxi.  Rolley Sims

## 2019-03-02 NOTE — Plan of Care (Signed)
  Problem: Education: Goal: Knowledge of General Education information will improve Description: Including pain rating scale, medication(s)/side effects and non-pharmacologic comfort measures Outcome: Progressing   Problem: Clinical Measurements: Goal: Respiratory complications will improve Outcome: Progressing Goal: Cardiovascular complication will be avoided Outcome: Progressing   Problem: Skin Integrity: Goal: Risk for impaired skin integrity will decrease Outcome: Progressing

## 2020-12-06 DEATH — deceased

## 2021-02-15 ENCOUNTER — Emergency Department: Payer: Medicare Other

## 2021-02-15 ENCOUNTER — Other Ambulatory Visit: Payer: Self-pay

## 2021-02-15 DIAGNOSIS — R197 Diarrhea, unspecified: Secondary | ICD-10-CM | POA: Insufficient documentation

## 2021-02-15 DIAGNOSIS — I10 Essential (primary) hypertension: Secondary | ICD-10-CM | POA: Insufficient documentation

## 2021-02-15 DIAGNOSIS — R110 Nausea: Secondary | ICD-10-CM | POA: Insufficient documentation

## 2021-02-15 DIAGNOSIS — F17210 Nicotine dependence, cigarettes, uncomplicated: Secondary | ICD-10-CM | POA: Insufficient documentation

## 2021-02-15 DIAGNOSIS — R222 Localized swelling, mass and lump, trunk: Secondary | ICD-10-CM | POA: Insufficient documentation

## 2021-02-15 DIAGNOSIS — K5732 Diverticulitis of large intestine without perforation or abscess without bleeding: Secondary | ICD-10-CM | POA: Diagnosis not present

## 2021-02-15 DIAGNOSIS — Z955 Presence of coronary angioplasty implant and graft: Secondary | ICD-10-CM | POA: Insufficient documentation

## 2021-02-15 DIAGNOSIS — Z79899 Other long term (current) drug therapy: Secondary | ICD-10-CM | POA: Insufficient documentation

## 2021-02-15 DIAGNOSIS — R1030 Lower abdominal pain, unspecified: Secondary | ICD-10-CM | POA: Insufficient documentation

## 2021-02-15 DIAGNOSIS — K5792 Diverticulitis of intestine, part unspecified, without perforation or abscess without bleeding: Secondary | ICD-10-CM | POA: Diagnosis not present

## 2021-02-15 DIAGNOSIS — I2510 Atherosclerotic heart disease of native coronary artery without angina pectoris: Secondary | ICD-10-CM | POA: Insufficient documentation

## 2021-02-15 LAB — COMPREHENSIVE METABOLIC PANEL
ALT: 14 U/L (ref 0–44)
AST: 14 U/L — ABNORMAL LOW (ref 15–41)
Albumin: 4 g/dL (ref 3.5–5.0)
Alkaline Phosphatase: 69 U/L (ref 38–126)
Anion gap: 9 (ref 5–15)
BUN: 24 mg/dL — ABNORMAL HIGH (ref 8–23)
CO2: 22 mmol/L (ref 22–32)
Calcium: 9.5 mg/dL (ref 8.9–10.3)
Chloride: 104 mmol/L (ref 98–111)
Creatinine, Ser: 1.18 mg/dL (ref 0.61–1.24)
GFR, Estimated: >60 mL/min (ref >60)
Glucose, Bld: 132 mg/dL — ABNORMAL HIGH (ref 70–99)
Potassium: 3.8 mmol/L (ref 3.5–5.1)
Sodium: 135 mmol/L (ref 135–145)
Total Bilirubin: 1.1 mg/dL (ref 0.3–1.2)
Total Protein: 7.2 g/dL (ref 6.5–8.1)

## 2021-02-15 LAB — CBC
HCT: 42.3 % (ref 39.0–52.0)
Hemoglobin: 14.7 g/dL (ref 13.0–17.0)
MCH: 30.7 pg (ref 26.0–34.0)
MCHC: 34.8 g/dL (ref 30.0–36.0)
MCV: 88.3 fL (ref 80.0–100.0)
Platelets: 207 10*3/uL (ref 150–400)
RBC: 4.79 MIL/uL (ref 4.22–5.81)
RDW: 14 % (ref 11.5–15.5)
WBC: 12.2 10*3/uL — ABNORMAL HIGH (ref 4.0–10.5)
nRBC: 0 % (ref 0.0–0.2)

## 2021-02-15 LAB — LIPASE, BLOOD: Lipase: 24 U/L (ref 11–51)

## 2021-02-15 NOTE — ED Triage Notes (Signed)
Pt BIB EMS c/o lower ABD pain that started earlier today. Pt endorse nausea and diarrhea. Pt denies vomiting and fevers.

## 2021-02-15 NOTE — ED Notes (Signed)
Per ems pt with lower abd pain since this am. Pe rems pt with diarrhea, per ems pt reported pink diarrhea and nausea. Vitals: 197/101, 97% on ra, 88, temp 98.7 oral.

## 2021-02-16 ENCOUNTER — Emergency Department
Admission: EM | Admit: 2021-02-16 | Discharge: 2021-02-16 | Disposition: A | Discharge status: Home / Self Care | Payer: Medicare Other | Source: Home / Self Care | Attending: Emergency Medicine | Admitting: Emergency Medicine

## 2021-02-16 ENCOUNTER — Other Ambulatory Visit: Payer: Self-pay

## 2021-02-16 DIAGNOSIS — R110 Nausea: Secondary | ICD-10-CM

## 2021-02-16 DIAGNOSIS — R222 Localized swelling, mass and lump, trunk: Secondary | ICD-10-CM

## 2021-02-16 DIAGNOSIS — R197 Diarrhea, unspecified: Secondary | ICD-10-CM

## 2021-02-16 DIAGNOSIS — R1030 Lower abdominal pain, unspecified: Secondary | ICD-10-CM

## 2021-02-16 MED ORDER — OXYCODONE HCL 10 MG PO TABS: 10.0000 mg | ORAL_TABLET | Freq: Four times a day (QID) | ORAL | 0 refills | Status: AC | PRN

## 2021-02-16 MED ORDER — PIPERACILLIN-TAZOBACTAM 3.375 G IVPB 30 MIN
3.3750 g | Freq: Once | INTRAVENOUS | Status: AC
  Administered 2021-02-16: 3.375 g via INTRAVENOUS
  Filled 2021-02-16: qty 50

## 2021-02-16 MED ORDER — ONDANSETRON HCL 4 MG/2ML IJ SOLN
4.0000 mg | Freq: Once | INTRAMUSCULAR | Status: AC
  Administered 2021-02-16: 4 mg via INTRAVENOUS
  Filled 2021-02-16: qty 2

## 2021-02-16 MED ORDER — SODIUM CHLORIDE 0.9 % IV BOLUS
1000.0000 mL | Freq: Once | INTRAVENOUS | Status: AC
  Administered 2021-02-16: 1000 mL via INTRAVENOUS

## 2021-02-16 MED ORDER — AMOXICILLIN-POT CLAVULANATE 875-125 MG PO TABS: 1.0000 | ORAL_TABLET | Freq: Two times a day (BID) | ORAL | 0 refills | Status: DC

## 2021-02-16 MED ORDER — MORPHINE SULFATE (PF) 2 MG/ML IV SOLN
2.0000 mg | Freq: Once | INTRAVENOUS | Status: AC
  Administered 2021-02-16: 2 mg via INTRAVENOUS
  Filled 2021-02-16: qty 1

## 2021-02-16 MED ORDER — OXYCODONE HCL 5 MG PO TABS
10.0000 mg | ORAL_TABLET | Freq: Once | ORAL | Status: AC
  Administered 2021-02-16: 10 mg via ORAL
  Filled 2021-02-16: qty 2

## 2021-02-16 MED ORDER — ONDANSETRON 4 MG PO TBDP: 4.0000 mg | ORAL_TABLET | Freq: Three times a day (TID) | ORAL | 0 refills | Status: DC | PRN

## 2021-02-16 NOTE — Discharge Instructions (Signed)
1.  Take antibiotic as prescribed (Augmentin 875 mg twice daily x7 days). 2.  You may take medicines as needed for pain and nausea (Percocet/Zofran #20). 3.  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  You understand that this may lead to fatal bleeding if not evaluated.  You expressed preference to see Dr. Ubaldo Glassing to further evaluate this with chest CT.  Please make an appointment as soon as possible with him to evaluate this chest mass. 4.  Return to the ER for worsening symptoms, persistent vomiting, fever, difficulty breathing or other concerns.

## 2021-02-16 NOTE — ED Notes (Signed)
Verbal consent for discharge and medications reviewed- pt verbalized understanding of all.

## 2021-02-16 NOTE — ED Notes (Signed)
Pt ready for discharge- needs taxi. 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 Ladona Mow contacted and informed transportation voucher needed for transport to home address [place]. Confirmed they will be on the way to the ER/

## 2021-02-16 NOTE — ED Provider Notes (Signed)
[facility] Emergency Department Provider Note   ____________________________________________   Event Date/Time   First MD Initiated Contact with Patient 02/16/21 0008     (approximate)  I have reviewed the triage vital signs and the nursing notes.   HISTORY  Chief Complaint Abdominal Pain    HPI [name] [name] is a 80 y.o. male brought to the ED via EMS from home with a chief complaint of lower abdominal pain for 12 to 14 hours.  Symptoms associated with nausea and diarrhea.  Patient denies fever, chills, cough, chest pain, shortness of breath, vomiting or dysuria.  Takes baby aspirin daily.     Past Medical History:  Diagnosis Date   Anxiety    Arthritis    right hand, lower back   Coronary artery disease    Degenerative disc disease, lumbar    Depression    Hyperlipidemia    Hypertension    Lumbar paraspinal muscle spasm    Lumbar spondylosis    Myocardial infarction [facility]) 2014   NSTEMI   Phlebitis of leg, left    s/p vein harvest for by-pass graft   Radiculitis, lumbosacral    Vitamin D deficiency     Patient Active Problem List   Diagnosis Date Noted   Acute posterior epistaxis 03/01/2019   CAD (coronary artery disease) 03/01/2019   Depression 03/01/2019   Essential hypertension 03/01/2019   Postural dizziness with presyncope 03/01/2019    Past Surgical History:  Procedure Laterality Date   CORONARY ARTERY BYPASS GRAFT  2014    4 vessel   CYST EXCISION     anal cyst   ESOPHAGOGASTRODUODENOSCOPY     MASS EXCISION Right 02/19/2015   Procedure: EXCISION MASS OF SOFT TISSUE RIGHT HAND;  Surgeon: Corky Mull, MD;  Location: [facility];  Service: Orthopedics;  Laterality: Right;  LOCAL PER DR POGGI   TONSILLECTOMY     age 39    Prior to Admission medications   Medication Sig Start Date End Date Taking? Authorizing Provider  amoxicillin-clavulanate (AUGMENTIN) 875-125 MG tablet Take 1 tablet by mouth 2 (two) times  daily. 02/16/21  Yes Paulette Blanch, MD  ondansetron (ZOFRAN-ODT) 4 MG disintegrating tablet Take 1 tablet (4 mg total) by mouth every 8 (eight) hours as needed for nausea or vomiting. 02/16/21  Yes Paulette Blanch, MD  oxyCODONE 10 MG TABS Take 1 tablet (10 mg total) by mouth every 6 (six) hours as needed for severe pain. 02/16/21  Yes Paulette Blanch, MD  atorvastatin (LIPITOR) 80 MG tablet Take 80 mg by mouth every evening. 1/2 of 80 mg tablet AM & PM     [provider]  busPIRone (BUSPAR) 15 MG tablet Take 30 mg by mouth at bedtime.     [provider]  diazepam (VALIUM) 5 MG tablet Take 5 mg by mouth every 6 (six) hours as needed for anxiety.    [provider]  lisinopril (PRINIVIL,ZESTRIL) 30 MG tablet Take 30 mg by mouth every evening.     [provider]  metoprolol tartrate (LOPRESSOR) 25 MG tablet Take 12.5 mg by mouth 2 (two) times daily.    [provider]  mirtazapine (REMERON) 30 MG tablet Take 30 mg by mouth daily.     [provider]    Allergies Adhesive [tape] and Duloxetine  Family History  Problem Relation Age of Onset   Alcohol abuse Other    Diabetes Neg Hx    CAD Neg  Hx     Social History Social History   Tobacco Use   Smoking status: Every Day    Packs/day: 0.75    Years: 28.00    Pack years: 21.00    Types: Cigarettes   Smokeless tobacco: Never  Substance Use Topics   Alcohol use: No   Drug use: Yes    Comment: prescribed oxy for chronic back pain    Review of Systems  Constitutional: No fever/chills Eyes: No visual changes. ENT: No sore throat. Cardiovascular: Denies chest pain. Respiratory: Denies shortness of breath. Gastrointestinal: Positive for lower abdominal pain and nausea, no vomiting.  Positive for diarrhea.  No constipation. Genitourinary: Negative for dysuria. Musculoskeletal: Negative for back pain. Skin: Negative for rash. Neurological: Negative for headaches, focal weakness or  numbness.   ____________________________________________   PHYSICAL EXAM:  VITAL SIGNS: ED Triage Vitals  Enc Vitals Group     BP 02/15/21 2102 (!) 169/96     Pulse Rate 02/15/21 2102 86     Resp 02/15/21 2102 16     Temp 02/15/21 2102 98.1 F (36.7 C)     Temp Source 02/15/21 2102 Oral     SpO2 02/15/21 2102 93 %     Weight 02/15/21 2103 165 lb (74.8 kg)     Height --      Head Circumference --      Peak Flow --      Pain Score 02/15/21 2103 1     Pain Loc --      Pain Edu? --      Excl. in [place]? --     Constitutional: Alert and oriented.  Elderly appearing and in mild acute distress. Eyes: Conjunctivae are normal. PERRL. EOMI. Head: Atraumatic. Nose: No congestion/rhinnorhea. Mouth/Throat: Mucous membranes are mildly dry.   Neck: No stridor.   Cardiovascular: Normal rate, regular rhythm. Grossly normal heart sounds.  Good peripheral circulation. Respiratory: Normal respiratory effort.  No retractions. Lungs CTAB. Gastrointestinal: Soft and mildly tender to palpation left lower quadrant without rebound or guarding. No distention. No abdominal bruits. No CVA tenderness. Musculoskeletal: No lower extremity tenderness nor edema.  No joint effusions. Neurologic:  Normal speech and language. No gross focal neurologic deficits are appreciated. No gait instability. Skin:  Skin is warm, dry and intact. No rash noted. Psychiatric: Mood and affect are normal. Speech and behavior are normal.  ____________________________________________   LABS (all labs ordered are listed, but only abnormal results are displayed)  Labs Reviewed  COMPREHENSIVE METABOLIC PANEL - Abnormal; Notable for the following components:      Result Value   Glucose, Bld 132 (*)    BUN 24 (*)    AST 14 (*)    All other components within normal limits  CBC - Abnormal; Notable for the following components:   WBC 12.2 (*)    All other components within normal limits  LIPASE, BLOOD    ____________________________________________  EKG  ED ECG REPORT I, Keileigh Vahey J, the attending physician, personally viewed and interpreted this ECG.   Date: 02/16/2021  EKG Time: 0122  Rate: 64  Rhythm: normal sinus rhythm  Axis: RAD  Intervals:right bundle branch block  ST&T Change: Nonspecific  ____________________________________________  RADIOLOGY I, Nhung Danko J, personally viewed and evaluated these images (plain radiographs) as part of my medical decision making, as well as reviewing the written report by the radiologist.  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 COMPARISON:  07/21/2015 FINDINGS: Lower chest: There appears to be an anterior mediastinal mass anterior to the ascending aorta measuring 5.7 cm. This area was not imaged on prior abdominal CT. Recommend further evaluation with chest CT. Coronary artery and aortic calcifications. Heart is normal size. No effusions. Hepatobiliary: Small cyst in the right hepatic lobe measures 17 mm and is unchanged. Gallbladder unremarkable. No biliary ductal dilatation. Pancreas: No focal abnormality or ductal dilatation. Spleen: Calcifications in the spleen compatible with old granulomatous disease. Adrenals/Urinary Tract: Small parapelvic cysts in the lower pole of the left kidney. No hydronephrosis bilaterally. No renal or ureteral stones. Adrenal glands unremarkable. Small left posterior bladder wall diverticulum.  Stomach/Bowel: Diffuse colonic diverticulosis. Inflammatory stranding around the proximal sigmoid colon compatible with active diverticulitis. Large diverticulum off the 2nd portion of the duodenum. This is unchanged. No evidence of bowel obstruction. Several small bowel loops appear to be thick walled in the right mid and lower abdomen concerning for enteritis. Normal appendix. Vascular/Lymphatic: 4 cm infrarenal abdominal aortic aneurysm. Aortic atherosclerosis. No adenopathy. Reproductive: Prostate enlargement. Other: Small amount of free fluid in the pelvis and adjacent to the liver and spleen. Musculoskeletal: Degenerative changes in the lumbar spine. No acute bony abnormality. 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 Recommend further evaluation with chest CT, preferably with IV contrast if patient is able to receive contrast. There appear to be several mildly thickened small bowel loops in the right abdomen concerning for enteritis. Diffuse colonic diverticulosis. Inflammatory stranding around the proximal sigmoid colon compatible with active diverticulitis. Small amount of free fluid in the abdomen and pelvis. 4 cm abdominal aortic aneurysm. Recommend follow-up every 12 months and vascular consultation. This recommendation follows ACR consensus guidelines: White Paper of the ACR Incidental Findings Committee II on Vascular Findings. J Am Coll Radiol 2013; 10:789-794. Electronically Signed   By: Rolm Baptise M.D.   On: 02/15/2021 23:56    ____________________________________________   PROCEDURES  Procedure(s) performed (including Critical Care):  .1-3 Lead EKG Interpretation Performed by: Paulette Blanch, MD Authorized by: Paulette Blanch, MD     Interpretation: normal     ECG rate:  85   ECG rate assessment: normal     Rhythm: sinus rhythm     Ectopy: none     Conduction: normal   Comments:     Patient placed on cardiac monitor to  evaluate for arrhythmias   ____________________________________________   INITIAL IMPRESSION / ASSESSMENT AND PLAN / ED COURSE  As part of my medical decision making, I reviewed the following data within the [facility] notes reviewed and incorporated, Labs reviewed, EKG interpreted, Old chart reviewed, Radiograph reviewed, Notes from prior ED visits, and [place] Controlled Substance Database     80 year old male presenting with lower abdominal pain, nausea and diarrhea. Differential diagnosis includes, but is not limited to, acute appendicitis, renal colic, testicular torsion, urinary tract infection/pyelonephritis, prostatitis,  epididymitis, diverticulitis, small bowel obstruction or ileus, colitis, abdominal aortic aneurysm, gastroenteritis, hernia, etc.   Laboratory results demonstrate mild leukocytosis, CT shows acute sigmoid diverticulitis, probable anterior mediastinal  mass recommends CT chest with IV contrast.  Patient updated with all results.  He declines further imaging of chest mass.  States he prefers just to focus on his abdominal pain tonight and he will follow-up with his cardiologist Dr. Ubaldo Glassing for further work-up of the chest mass.  He understands that there is a possibility that he could suffer a fatal hemorrhage if the mass is intertwined with his aorta.  Patient verbalizes understanding and continues to insist that he will follow-up with his cardiologist rather than have a CT chest done in the ED tonight for further evaluation of anterior chest mass.  Clinical Course as of 02/16/21 0558  Mon Feb 16, 2021  0232 Patient feeling better.  States he occasionally takes 10 mg oxycodone for acute on chronic back pain.  Will administer 1 now to last him overnight and discharged home with prescription for antibiotic, analgesia and antiemetic.  Strict return precautions given.  Patient verbalizes understanding and agrees with plan of care. [JS]    Clinical Course User  Index [JS] Paulette Blanch, MD     ____________________________________________   FINAL CLINICAL IMPRESSION(S) / ED DIAGNOSES  Final diagnoses:  Lower abdominal pain  Diarrhea, unspecified type  Nausea  Chest mass     ED Discharge Orders          Ordered    amoxicillin-clavulanate (AUGMENTIN) 875-125 MG tablet  2 times daily        02/16/21 0235    ondansetron (ZOFRAN-ODT) 4 MG disintegrating tablet  Every 8 hours PRN        02/16/21 0235    oxyCODONE 10 MG TABS  Every 6 hours PRN        02/16/21 0235             Note:  This document was prepared using Dragon voice recognition software and may include unintentional dictation errors.    Paulette Blanch, MD 02/16/21 [phone]

## 2021-02-17 ENCOUNTER — Other Ambulatory Visit: Payer: Self-pay

## 2021-02-17 ENCOUNTER — Inpatient Hospital Stay
Admission: EM | Admit: 2021-02-17 | Discharge: 2021-02-18 | DRG: 392 | Disposition: A | Discharge status: Home / Self Care | Payer: Medicare Other | Attending: Internal Medicine | Admitting: Internal Medicine

## 2021-02-17 ENCOUNTER — Emergency Department: Payer: Medicare Other

## 2021-02-17 ENCOUNTER — Other Ambulatory Visit: Payer: Self-pay | Admitting: Oncology

## 2021-02-17 DIAGNOSIS — R222 Localized swelling, mass and lump, trunk: Secondary | ICD-10-CM | POA: Diagnosis present

## 2021-02-17 DIAGNOSIS — K5792 Diverticulitis of intestine, part unspecified, without perforation or abscess without bleeding: Secondary | ICD-10-CM | POA: Diagnosis present

## 2021-02-17 DIAGNOSIS — F419 Anxiety disorder, unspecified: Secondary | ICD-10-CM | POA: Diagnosis present

## 2021-02-17 DIAGNOSIS — J9859 Other diseases of mediastinum, not elsewhere classified: Secondary | ICD-10-CM

## 2021-02-17 DIAGNOSIS — I4510 Unspecified right bundle-branch block: Secondary | ICD-10-CM | POA: Diagnosis present

## 2021-02-17 DIAGNOSIS — Z951 Presence of aortocoronary bypass graft: Secondary | ICD-10-CM | POA: Diagnosis not present

## 2021-02-17 DIAGNOSIS — Z20822 Contact with and (suspected) exposure to covid-19: Secondary | ICD-10-CM | POA: Diagnosis present

## 2021-02-17 DIAGNOSIS — Z91048 Other nonmedicinal substance allergy status: Secondary | ICD-10-CM

## 2021-02-17 DIAGNOSIS — I10 Essential (primary) hypertension: Secondary | ICD-10-CM | POA: Diagnosis present

## 2021-02-17 DIAGNOSIS — I2510 Atherosclerotic heart disease of native coronary artery without angina pectoris: Secondary | ICD-10-CM | POA: Diagnosis present

## 2021-02-17 DIAGNOSIS — F32A Depression, unspecified: Secondary | ICD-10-CM | POA: Diagnosis present

## 2021-02-17 DIAGNOSIS — R1900 Intra-abdominal and pelvic swelling, mass and lump, unspecified site: Secondary | ICD-10-CM

## 2021-02-17 DIAGNOSIS — K529 Noninfective gastroenteritis and colitis, unspecified: Secondary | ICD-10-CM | POA: Diagnosis present

## 2021-02-17 DIAGNOSIS — I714 Abdominal aortic aneurysm, without rupture, unspecified: Secondary | ICD-10-CM

## 2021-02-17 DIAGNOSIS — I252 Old myocardial infarction: Secondary | ICD-10-CM | POA: Diagnosis not present

## 2021-02-17 DIAGNOSIS — K5732 Diverticulitis of large intestine without perforation or abscess without bleeding: Principal | ICD-10-CM | POA: Diagnosis present

## 2021-02-17 DIAGNOSIS — K219 Gastro-esophageal reflux disease without esophagitis: Secondary | ICD-10-CM | POA: Diagnosis present

## 2021-02-17 DIAGNOSIS — Z79899 Other long term (current) drug therapy: Secondary | ICD-10-CM

## 2021-02-17 DIAGNOSIS — D4989 Neoplasm of unspecified behavior of other specified sites: Secondary | ICD-10-CM

## 2021-02-17 DIAGNOSIS — E785 Hyperlipidemia, unspecified: Secondary | ICD-10-CM | POA: Diagnosis present

## 2021-02-17 DIAGNOSIS — Z7982 Long term (current) use of aspirin: Secondary | ICD-10-CM

## 2021-02-17 DIAGNOSIS — Z888 Allergy status to other drugs, medicaments and biological substances status: Secondary | ICD-10-CM | POA: Diagnosis not present

## 2021-02-17 DIAGNOSIS — F17210 Nicotine dependence, cigarettes, uncomplicated: Secondary | ICD-10-CM | POA: Diagnosis present

## 2021-02-17 LAB — CBC WITH DIFFERENTIAL/PLATELET
Abs Immature Granulocytes: 0.02 10*3/uL (ref 0.00–0.07)
Basophils Absolute: 0 10*3/uL (ref 0.0–0.1)
Basophils Relative: 0 %
Eosinophils Absolute: 0 10*3/uL (ref 0.0–0.5)
Eosinophils Relative: 0 %
HCT: 37.6 % — ABNORMAL LOW (ref 39.0–52.0)
Hemoglobin: 12.6 g/dL — ABNORMAL LOW (ref 13.0–17.0)
Immature Granulocytes: 0 %
Lymphocytes Relative: 6 %
Lymphs Abs: 0.5 10*3/uL — ABNORMAL LOW (ref 0.7–4.0)
MCH: 30.2 pg (ref 26.0–34.0)
MCHC: 33.5 g/dL (ref 30.0–36.0)
MCV: 90.2 fL (ref 80.0–100.0)
Monocytes Absolute: 0.5 10*3/uL (ref 0.1–1.0)
Monocytes Relative: 6 %
Neutro Abs: 7.4 10*3/uL (ref 1.7–7.7)
Neutrophils Relative %: 88 %
Platelets: 177 10*3/uL (ref 150–400)
RBC: 4.17 MIL/uL — ABNORMAL LOW (ref 4.22–5.81)
RDW: 13.9 % (ref 11.5–15.5)
WBC: 8.5 10*3/uL (ref 4.0–10.5)
nRBC: 0 % (ref 0.0–0.2)

## 2021-02-17 LAB — BASIC METABOLIC PANEL
Anion gap: 6 (ref 5–15)
BUN: 30 mg/dL — ABNORMAL HIGH (ref 8–23)
CO2: 23 mmol/L (ref 22–32)
Calcium: 8.6 mg/dL — ABNORMAL LOW (ref 8.9–10.3)
Chloride: 104 mmol/L (ref 98–111)
Creatinine, Ser: 1.35 mg/dL — ABNORMAL HIGH (ref 0.61–1.24)
GFR, Estimated: 53 mL/min — ABNORMAL LOW (ref >60)
Glucose, Bld: 95 mg/dL (ref 70–99)
Potassium: 4.1 mmol/L (ref 3.5–5.1)
Sodium: 133 mmol/L — ABNORMAL LOW (ref 135–145)

## 2021-02-17 LAB — RESP PANEL BY RT-PCR (FLU A&B, COVID) ARPGX2
Influenza A by PCR: NEGATIVE
Influenza B by PCR: NEGATIVE
SARS Coronavirus 2 by RT PCR: NEGATIVE

## 2021-02-17 LAB — TROPONIN I (HIGH SENSITIVITY)
Troponin I (High Sensitivity): 19 ng/L — ABNORMAL HIGH (ref <18)
Troponin I (High Sensitivity): 14 ng/L (ref <18)

## 2021-02-17 MED ORDER — OXYCODONE HCL 5 MG PO TABS: 10.0000 mg | ORAL_TABLET | Freq: Four times a day (QID) | ORAL | Status: DC | PRN

## 2021-02-17 MED ORDER — MIRTAZAPINE 15 MG PO TABS: 45.0000 mg | ORAL_TABLET | Freq: Every day | ORAL | Status: DC

## 2021-02-17 MED ORDER — LACTATED RINGERS IV BOLUS
1000.0000 mL | Freq: Once | INTRAVENOUS | Status: AC
  Administered 2021-02-17: 1000 mL via INTRAVENOUS

## 2021-02-17 MED ORDER — IOHEXOL 350 MG/ML SOLN
60.0000 mL | Freq: Once | INTRAVENOUS | Status: AC | PRN
  Administered 2021-02-17: 60 mL via INTRAVENOUS

## 2021-02-17 MED ORDER — SODIUM CHLORIDE 0.9 % IV SOLN
3.0000 g | Freq: Once | INTRAVENOUS | Status: AC
  Administered 2021-02-17: 3 g via INTRAVENOUS
  Filled 2021-02-17: qty 8

## 2021-02-17 MED ORDER — ATORVASTATIN CALCIUM 20 MG PO TABS: 80.0000 mg | ORAL_TABLET | Freq: Every day | ORAL | Status: DC

## 2021-02-17 MED ORDER — BACLOFEN 10 MG PO TABS: 5.0000 mg | ORAL_TABLET | Freq: Two times a day (BID) | ORAL | Status: DC | PRN

## 2021-02-17 MED ORDER — HYDROXYZINE HCL 25 MG PO TABS
25.0000 mg | ORAL_TABLET | Freq: Every day | ORAL | Status: DC
  Administered 2021-02-18: 10:00:00 25 mg via ORAL
  Filled 2021-02-17: qty 1

## 2021-02-17 MED ORDER — ONDANSETRON 4 MG PO TBDP
4.0000 mg | ORAL_TABLET | Freq: Three times a day (TID) | ORAL | Status: DC | PRN
  Administered 2021-02-18: 4 mg via ORAL
  Filled 2021-02-17 (×3): qty 1

## 2021-02-17 MED ORDER — METOPROLOL TARTRATE 25 MG PO TABS: 25.0000 mg | ORAL_TABLET | Freq: Every day | ORAL | Status: DC

## 2021-02-17 MED ORDER — NICOTINE 21 MG/24HR TD PT24
21.0000 mg | MEDICATED_PATCH | Freq: Once | TRANSDERMAL | Status: AC
  Administered 2021-02-17: 21 mg via TRANSDERMAL
  Filled 2021-02-17: qty 1

## 2021-02-17 MED ORDER — AMPICILLIN-SULBACTAM SODIUM 3 (2-1) G IJ SOLR
3.0000 g | Freq: Four times a day (QID) | INTRAMUSCULAR | Status: DC
  Administered 2021-02-18 (×2): 3 g via INTRAVENOUS
  Filled 2021-02-17 (×3): qty 8
  Filled 2021-02-17: qty 3
  Filled 2021-02-17 (×2): qty 8

## 2021-02-17 MED ORDER — BUSPIRONE HCL 10 MG PO TABS
20.0000 mg | ORAL_TABLET | Freq: Two times a day (BID) | ORAL | Status: DC
  Administered 2021-02-18: 10:00:00 20 mg via ORAL
  Filled 2021-02-17 (×3): qty 2

## 2021-02-17 NOTE — Progress Notes (Addendum)
Secure chat sent to [facility] for patient's request for his meds. Esp zofran at this time. Patient feels nauseated.   Orders entered by Damita Dunnings MD.

## 2021-02-17 NOTE — ED Provider Notes (Cosign Needed)
Emergency Medicine Provider Triage Evaluation Note  [name] [name] , a 80 y.o. male  was evaluated in triage.  Pt complains of central chest mass.  Patient was in the yesterday, and was treated for an acute diverticulitis.  He had an incidental finding of a mediastinal mass on his abdomen/pelvis CT.  Patient declined at that time, to have the chest mass further evaluated.  He returned to the ED at the advice of his home health nurse, for the requested CT chest.  Patient denies any chest pain or shortness of breath.  He does note improvement of his lower abdominal discomfort.  Review of Systems  Positive: CP, SOB Negative: NVD  Physical Exam  BP (!) 164/55    Pulse 69    Temp 98.2 F (36.8 C) (Oral)    Resp 20    Wt 75 kg    SpO2 97%    BMI 22.74 kg/m  Gen:   Awake, no distress  NAD Resp:  Normal effort CTA MSK:   Moves extremities without difficulty  Other:  CVS: RRR  Medical Decision Making  Medically screening exam initiated at 5:31 PM.  Appropriate orders placed.  Kamin Niblack was informed that the remainder of the evaluation will be completed by another provider, this initial triage assessment does not replace that evaluation, and the importance of remaining in the ED until their evaluation is complete.  Patient presented to the ED for CT chest imaging following an incidental finding of a mediastinal mass on his most recent abdomen pelvis CT 1 day prior.   Melvenia Needles, PA-C 02/17/21 1733

## 2021-02-17 NOTE — H&P (Signed)
History and Physical    [name] [name] VVO:[id number] DOB: 11-Jan-1941 DOA: 02/17/2021  PCP: Teodoro Spray, MD   Patient coming from: home  I have personally briefly reviewed patient's relevant medical records in [place]  Chief Complaint: abdominal pain, chest mass  HPI: [name] [name] is a 80 y.o. male with medical history significant for CAD s/p CABG, HTN, upper GI bleed, lumbar DDD, treated in the ED on 12/12 for acute diverticulitis with incidental finding of mediastinal mass on abdominal pelvic CT, declining further evaluation of the time and signing out AMA, who presents to the ED with continued lower abdominal discomfort and poor oral intake.  He denies fever or chills, nausea or vomiting. He was encouraged to return to the ED by his psychiatrist dute to concern for his wellbeing and ability to go through with follow up. The EDP  contacted oncology who stated that they will see him in the am though patient at this time states he may not be interested in any surgery or additional workup.  ED course: BP 164/55 with otherwise normal vitals Blood work unremarkable with troponin 19-14, sodium 133, hemoglobin 12.6  EKG, personally viewed and interpreted: Sinus rhythm, RBBB, IVCD  Imaging Chest CT from 12/13: Indeterminate solid mass within anterior mediastinum averaging 6 x 4 x 5 CT abdomen and pelvis from 12/11: Several mildly thickened small bowel loops in the right abdomen concerning for enteritis, inflammatory stranding around the proximal sigmoid colon compatible with active diverticulitis.  4 cm AAA  Patient started on IV fluids and Unasyn.  Hospitalist consulted for admission.   Review of Systems: As per HPI otherwise all other systems on review of systems negative.   Assessment/Plan    Acute diverticulitis -Unasyn - Clear liquid diet and advance as tolerated - Symptomatic treatment with pain meds, antiemetics - IV hydration    Anterior mediastinal tumor -  Consider oncology consult in a.m. versus outpatient referral    CAD (coronary artery disease)   S/P CABG (coronary artery bypass graft) - No complaints of chest pain and troponin without ACS trend, EKG nonacute - Continue home metoprolol and lisinopril    Essential hypertension - Controlled.  Continue home metoprolol and lisinopril    RBBB - Stable.  Unchanged    AAA (abdominal aortic aneurysm) - Incidental finding.  Will need recommended surveillance   DVT prophylaxis: Lovenox  Code Status: full code  Family Communication:  none  Disposition Plan: Back to previous home environment Consults called: none  Status:At the time of admission, it appears that the appropriate admission status for this patient is INPATIENT. This is judged to be reasonable and necessary in order to provide the required intensity of service to ensure the patient's safety given the presenting symptoms, physical exam findings, and initial radiographic and laboratory data in the context of their  Comorbid conditions.   Patient requires inpatient status due to high intensity of service, high risk for further deterioration and high frequency of surveillance required.   I certify that at the point of admission it is my clinical judgment that the patient will require inpatient hospital care spanning beyond 2 midnights     Physical Exam: Vitals:   02/17/21 1722 02/17/21 2031  BP: (!) 164/55 125/74  Pulse: 69 84  Resp: 20 16  Temp: 98.2 F (36.8 C) 98.4 F (36.9 C)  TempSrc: Oral Oral  SpO2: 97% 98%  Weight: 75 kg    Constitutional: Alert, oriented x 3 . Not in any  apparent distress HEENT:      Head: Normocephalic and atraumatic.         Eyes: PERLA, EOMI, Conjunctivae are normal. Sclera is non-icteric.       Mouth/Throat: Mucous membranes are moist.       Neck: Supple with no signs of meningismus. Cardiovascular: Regular rate and rhythm. No murmurs, gallops, or rubs. 2+ symmetrical distal pulses are  present . No JVD. No  LE edema Respiratory: Respiratory effort normal .Lungs sounds clear bilaterally. No wheezes, crackles, or rhonchi.  Gastrointestinal: Soft, non tender, non distended. Positive bowel sounds.  Genitourinary: No CVA tenderness. Musculoskeletal: Nontender with normal range of motion in all extremities. No cyanosis, or erythema of extremities. Neurologic:  Face is symmetric. Moving all extremities. No gross focal neurologic deficits . Skin: Skin is warm, dry.  No rash or ulcers Psychiatric: Mood and affect are appropriate     Past Medical History:  Diagnosis Date   Anxiety    Arthritis    right hand, lower back   Coronary artery disease    Degenerative disc disease, lumbar    Depression    Hyperlipidemia    Hypertension    Lumbar paraspinal muscle spasm    Lumbar spondylosis    Myocardial infarction ([place]) 2014   NSTEMI   Phlebitis of leg, left    s/p vein harvest for by-pass graft   Radiculitis, lumbosacral    Vitamin D deficiency     Past Surgical History:  Procedure Laterality Date   CORONARY ARTERY BYPASS GRAFT  2014    4 vessel   CYST EXCISION     anal cyst   ESOPHAGOGASTRODUODENOSCOPY     MASS EXCISION Right 02/19/2015   Procedure: EXCISION MASS OF SOFT TISSUE RIGHT HAND;  Surgeon: Corky Mull, MD;  Location: [place];  Service: Orthopedics;  Laterality: Right;  LOCAL PER DR POGGI   TONSILLECTOMY     age 39     reports that he has been smoking cigarettes. He has a 21.00 pack-year smoking history. He has never used smokeless tobacco. He reports current drug use. He reports that he does not drink alcohol.  Allergies  Allergen Reactions   Adhesive [Tape] Other (See Comments)    bandaids cause petechiae when removed   Duloxetine Other (See Comments)    Urinary retention    Family History  Problem Relation Age of Onset   Alcohol abuse Other    Diabetes Neg Hx    CAD Neg Hx       Prior to Admission medications   Medication Sig  Start Date End Date Taking? Authorizing Provider  amoxicillin-clavulanate (AUGMENTIN) 875-125 MG tablet Take 1 tablet by mouth 2 (two) times daily. 02/16/21   Paulette Blanch, MD  atorvastatin (LIPITOR) 80 MG tablet Take 80 mg by mouth every evening. 1/2 of 80 mg tablet AM & PM     [provider]  busPIRone (BUSPAR) 15 MG tablet Take 30 mg by mouth at bedtime.     [provider]  diazepam (VALIUM) 5 MG tablet Take 5 mg by mouth every 6 (six) hours as needed for anxiety.    [provider]  lisinopril (PRINIVIL,ZESTRIL) 30 MG tablet Take 30 mg by mouth every evening.     [provider]  metoprolol tartrate (LOPRESSOR) 25 MG tablet Take 12.5 mg by mouth 2 (two) times daily.    [provider]  mirtazapine (REMERON) 30 MG tablet Take 30 mg by mouth daily.  [provider]  ondansetron (ZOFRAN-ODT) 4 MG disintegrating tablet Take 1 tablet (4 mg total) by mouth every 8 (eight) hours as needed for nausea or vomiting. 02/16/21   Paulette Blanch, MD  oxyCODONE 10 MG TABS Take 1 tablet (10 mg total) by mouth every 6 (six) hours as needed for severe pain. 02/16/21   Paulette Blanch, MD      Labs on Admission: I have personally reviewed following labs and imaging studies  CBC: Recent Labs  Lab 02/15/21 2107 02/17/21 1723  WBC 12.2* 8.5  NEUTROABS  --  7.4  HGB 14.7 12.6*  HCT 42.3 37.6*  MCV 88.3 90.2  PLT 207 379   Basic Metabolic Panel: Recent Labs  Lab 02/15/21 2107 02/17/21 1723  NA 135 133*  K 3.8 4.1  CL 104 104  CO2 22 23  GLUCOSE 132* 95  BUN 24* 30*  CREATININE 1.18 1.35*  CALCIUM 9.5 8.6*   GFR: CrCl cannot be calculated (Unknown ideal weight.). Liver Function Tests: Recent Labs  Lab 02/15/21 2107  AST 14*  ALT 14  ALKPHOS 69  BILITOT 1.1  PROT 7.2  ALBUMIN 4.0   Recent Labs  Lab 02/15/21 2107  LIPASE 24   No results for input(s): AMMONIA in the last 168 hours. Coagulation Profile: No results for input(s):  INR, PROTIME in the last 168 hours. Cardiac Enzymes: No results for input(s): CKTOTAL, CKMB, CKMBINDEX, TROPONINI in the last 168 hours. BNP (last 3 results) No results for input(s): PROBNP in the last 8760 hours. HbA1C: No results for input(s): HGBA1C in the last 72 hours. CBG: No results for input(s): GLUCAP in the last 168 hours. Lipid Profile: No results for input(s): CHOL, HDL, LDLCALC, TRIG, CHOLHDL, LDLDIRECT in the last 72 hours. Thyroid Function Tests: No results for input(s): TSH, T4TOTAL, FREET4, T3FREE, THYROIDAB in the last 72 hours. Anemia Panel: No results for input(s): VITAMINB12, FOLATE, FERRITIN, TIBC, IRON, RETICCTPCT in the last 72 hours. Urine analysis:    Component Value Date/Time   COLORURINE STRAW (A) 07/21/2015 0232   APPEARANCEUR CLEAR (A) 07/21/2015 0232   APPEARANCEUR Clear 03/19/2013 1549   LABSPEC 1.005 07/21/2015 0232   LABSPEC 1.012 03/19/2013 1549   PHURINE 5.0 07/21/2015 0232   GLUCOSEU NEGATIVE 07/21/2015 0232   GLUCOSEU Negative 03/19/2013 1549   HGBUR 1+ (A) 07/21/2015 0232   BILIRUBINUR NEGATIVE 07/21/2015 0232   BILIRUBINUR Negative 03/19/2013 1549   KETONESUR NEGATIVE 07/21/2015 0232   PROTEINUR NEGATIVE 07/21/2015 0232   NITRITE NEGATIVE 07/21/2015 0232   LEUKOCYTESUR NEGATIVE 07/21/2015 0232   LEUKOCYTESUR Negative 03/19/2013 1549    Radiological Exams on Admission: CT Abdomen Pelvis Wo Contrast  Result Date: 02/15/2021 CLINICAL DATA:  Left lower quadrant abdominal pain, nausea, vomiting EXAM: CT ABDOMEN AND PELVIS WITHOUT CONTRAST TECHNIQUE: Multidetector CT imaging of the abdomen and pelvis was performed following the standard protocol without IV contrast. COMPARISON:  07/21/2015 FINDINGS: Lower chest: There appears to be an anterior mediastinal mass anterior to the ascending aorta measuring 5.7 cm. This area was not imaged on prior abdominal CT. Recommend further evaluation with chest CT. Coronary artery and aortic calcifications.  Heart is normal size. No effusions. Hepatobiliary: Small cyst in the right hepatic lobe measures 17 mm and is unchanged. Gallbladder unremarkable. No biliary ductal dilatation. Pancreas: No focal abnormality or ductal dilatation. Spleen: Calcifications in the spleen compatible with old granulomatous disease. Adrenals/Urinary Tract: Small parapelvic cysts in the lower pole of the left kidney. No hydronephrosis bilaterally. No renal  or ureteral stones. Adrenal glands unremarkable. Small left posterior bladder wall diverticulum. Stomach/Bowel: Diffuse colonic diverticulosis. Inflammatory stranding around the proximal sigmoid colon compatible with active diverticulitis. Large diverticulum off the 2nd portion of the duodenum. This is unchanged. No evidence of bowel obstruction. Several small bowel loops appear to be thick walled in the right mid and lower abdomen concerning for enteritis. Normal appendix. Vascular/Lymphatic: 4 cm infrarenal abdominal aortic aneurysm. Aortic atherosclerosis. No adenopathy. Reproductive: Prostate enlargement. Other: Small amount of free fluid in the pelvis and adjacent to the liver and spleen. Musculoskeletal: Degenerative changes in the lumbar spine. No acute bony abnormality. 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 Recommend further evaluation with chest CT, preferably with IV contrast if patient is able to receive contrast. There appear to be several mildly thickened small bowel loops in the right abdomen concerning for enteritis. Diffuse colonic diverticulosis. Inflammatory stranding around the proximal sigmoid colon compatible with active diverticulitis. Small amount of free fluid in the abdomen and pelvis. 4 cm abdominal aortic aneurysm. Recommend follow-up every 12 months and vascular consultation. This recommendation follows ACR consensus guidelines: White Paper of the ACR Incidental Findings Committee II on Vascular  Findings. J Am Coll Radiol 2013; 10:789-794. Electronically Signed   By: Rolm Baptise M.D.   On: 02/15/2021 23:56   CT Chest W Contrast  Result Date: 02/17/2021 CLINICAL DATA:  Central chest mass EXAM: CT CHEST WITH CONTRAST TECHNIQUE: Multidetector CT imaging of the chest was performed during intravenous contrast administration. CONTRAST:  20mL OMNIPAQUE IOHEXOL 350 MG/ML SOLN COMPARISON:  CT 02/15/2021 FINDINGS: Cardiovascular: Post CABG changes. Tortuous thoracic aortic arch with moderate aortic atherosclerosis. Mild aneurysmal dilatation of the ascending aorta up to 4 cm. No dissection is seen. Common origin of the left common carotid and right brachiocephalic vessels. Borderline cardiomegaly. No pericardial effusion. Mediastinum/Nodes: Midline trachea. No thyroid mass. Mostly solid anterior mediastinal mass measuring 6.4 by 4.7 by 5.6 cm anterior to the aortic root and ascending aorta. Mass appears to contain small cystic spaces. Esophagus within normal limits. No suspicious lymph nodes. Lungs/Pleura: No acute consolidation, pleural effusion, or pneumothorax. Mild subpleural reticulation consistent with scarring. Punctate 2-3 mm peripheral right upper lobe lung nodules, series 3, image 59 and punctate subpleural right lower lobe lung nodule, series 3, image 97. Upper Abdomen: Cyst within the liver. Exophytic cyst at the anterolateral spleen. Small amount of free fluid in the upper quadrants. Musculoskeletal: Post sternotomy changes. No acute osseous abnormality. IMPRESSION: 1. 6.4 x 4.7 x 5.6 cm indeterminate solid mass within the anterior mediastinum; mass could represent thymoma, thymic carcinoma, or potential lymphoma. Cardiothoracic surgical consultation is recommended. 2. Post CABG changes. Ascending aortic diameter up to 4 cm. Recommend annual imaging followup by CTA or MRA. This recommendation follows 2010 ACCF/AHA/AATS/ACR/ASA/SCA/SCAI/SIR/STS/SVM Guidelines for the Diagnosis and Management of  Patients with Thoracic Aortic Disease. Circulation. 2010; 121: J696-V893. Aortic aneurysm NOS (ICD10-I71.9) 3. Punctate pulmonary nodules measuring up to 3 mm. No follow-up needed if patient is low-risk (and has no known or suspected primary neoplasm). Non-contrast chest CT can be considered in 12 months if patient is high-risk. This recommendation follows the consensus statement: Guidelines for Management of Incidental Pulmonary Nodules Detected on CT Images: From the Fleischner Society 2017; Radiology 2017; 284:228-243. 4. Trace free fluid in the bilateral upper quadrants Aortic aneurysm NOS (ICD10-I71.9).Aortic Atherosclerosis (ICD10-I70.0). Electronically Signed   By: Donavan Foil M.D.   On: 02/17/2021 18:34  Athena Masse MD [facility]   02/17/2021, 9:47 PM

## 2021-02-17 NOTE — Progress Notes (Signed)
Consult from [facility] for mediastinal mass.  Refused Admission.  Plan for outpatient work-up with PET scan and biopsy.   Faythe Casa, NP 02/17/2021 8:38 PM

## 2021-02-17 NOTE — ED Notes (Signed)
First Nurse-pt brought in via ems from home for eval of mass in chest.  No chest pain or sob.   Per ems, pt reportedly just wants to be checked out.

## 2021-02-17 NOTE — ED Provider Notes (Signed)
[facility] Emergency Department Provider Note   ____________________________________________   Event Date/Time   First MD Initiated Contact with Patient 02/17/21 1930     (approximate)  I have reviewed the triage vital signs and the nursing notes.   HISTORY  Chief Complaint Abdominal Pain    HPI Onaje Warne is a 80 y.o. male with past medical history of hypertension, hyperlipidemia, CAD status post CABG, and depression who presents to the ED complaining of abdominal pain.  Patient was seen in the ED 2 nights ago for abdominal pain and found to have a case of uncomplicated diverticulitis at that time.  He was also incidentally found to have mediastinal mass and it was recommended that he undergo CT imaging of this mass, however patient opted to sign out of the hospital [place].  He returns today to have CT imaging of his chest performed.  He denies any fevers, cough, chest pain, or shortness of breath.  He currently reports minimal pain in the left lower quadrant of his abdomen and denies any fevers, nausea, vomiting, diarrhea, or bloody stools.  He does admit that he has not had anything to eat in the past 3 days due to poor appetite, states that he continues to drink fluids.  His psychiatrist, Dr. Nicolasa Ducking, had convince the patient to be reevaluated today and reports that he complained of severe pain earlier in the day affecting his abdomen.        Past Medical History:  Diagnosis Date   Anxiety    Arthritis    right hand, lower back   Coronary artery disease    Degenerative disc disease, lumbar    Depression    Hyperlipidemia    Hypertension    Lumbar paraspinal muscle spasm    Lumbar spondylosis    Myocardial infarction [facility]) 2014   NSTEMI   Phlebitis of leg, left    s/p vein harvest for by-pass graft   Radiculitis, lumbosacral    Vitamin D deficiency     Patient Active Problem List   Diagnosis Date Noted   Acute  diverticulitis 02/17/2021   RBBB 02/17/2021   AAA (abdominal aortic aneurysm) 02/17/2021   Anterior mediastinal tumor 02/17/2021   Acute posterior epistaxis 03/01/2019   CAD (coronary artery disease) 03/01/2019   Depression 03/01/2019   Essential hypertension 03/01/2019   Postural dizziness with presyncope 03/01/2019   S/P CABG (coronary artery bypass graft) 10/02/2012    Past Surgical History:  Procedure Laterality Date   CORONARY ARTERY BYPASS GRAFT  2014    4 vessel   CYST EXCISION     anal cyst   ESOPHAGOGASTRODUODENOSCOPY     MASS EXCISION Right 02/19/2015   Procedure: EXCISION MASS OF SOFT TISSUE RIGHT HAND;  Surgeon: Corky Mull, MD;  Location: [place];  Service: Orthopedics;  Laterality: Right;  LOCAL PER DR POGGI   TONSILLECTOMY     age 55    Prior to Admission medications   Medication Sig Start Date End Date Taking? Authorizing Provider  amoxicillin-clavulanate (AUGMENTIN) 875-125 MG tablet Take 1 tablet by mouth 2 (two) times daily. 02/16/21  Yes Paulette Blanch, MD  atorvastatin (LIPITOR) 80 MG tablet Take 80 mg by mouth daily.   Yes [provider]  Baclofen 5 MG TABS Take 0.5-1 tablets by mouth 2 (two) times daily as needed. 09/22/20  Yes [provider]  busPIRone (BUSPAR) 10 MG tablet Take 20 mg by mouth 2 (two) times daily. 02/06/21  Yes [provider]  hydrOXYzine (ATARAX) 25 MG tablet Take 25 mg by mouth daily. 02/06/21  Yes [provider]  lisinopril (PRINIVIL,ZESTRIL) 30 MG tablet Take 30 mg by mouth every evening.    Yes [provider]  metoprolol tartrate (LOPRESSOR) 25 MG tablet Take 25 mg by mouth daily.   Yes [provider]  mirtazapine (REMERON) 45 MG tablet Take 45 mg by mouth at bedtime. 02/06/21  Yes [provider]  ondansetron (ZOFRAN-ODT) 4 MG disintegrating tablet Take 1 tablet (4 mg total) by mouth every 8 (eight) hours as needed for nausea or vomiting. 02/16/21  Yes Paulette Blanch, MD  oxyCODONE 10 MG TABS Take 1 tablet (10 mg total) by mouth every 6 (six) hours as needed for severe pain. 02/16/21  Yes Paulette Blanch, MD  busPIRone (BUSPAR) 15 MG tablet Take 30 mg by mouth at bedtime.  Patient not taking: Reported on 02/17/2021    [provider]  diazepam (VALIUM) 5 MG tablet Take 5 mg by mouth every 6 (six) hours as needed for anxiety. Patient not taking: Reported on 02/17/2021    [provider]  mirtazapine (REMERON) 30 MG tablet Take 30 mg by mouth daily.  Patient not taking: Reported on 02/17/2021    [provider]    Allergies Adhesive [tape] and Duloxetine  Family History  Problem Relation Age of Onset   Alcohol abuse Other    Diabetes Neg Hx    CAD Neg Hx     Social History Social History   Tobacco Use   Smoking status: Every Day    Packs/day: 0.75    Years: 28.00    Pack years: 21.00    Types: Cigarettes   Smokeless tobacco: Never  Substance Use Topics   Alcohol use: No   Drug use: Yes    Comment: prescribed oxy for chronic back pain    Review of Systems  Constitutional: No fever/chills Eyes: No visual changes. ENT: No sore throat. Cardiovascular: Denies chest pain. Respiratory: Denies shortness of breath. Gastrointestinal: Positive for abdominal pain.  Positive for decreased oral intake.  No nausea, no vomiting.  No diarrhea.  No constipation. Genitourinary: Negative for dysuria. Musculoskeletal: Negative for back pain. Skin: Negative for rash. Neurological: Negative for headaches, focal weakness or numbness.  ____________________________________________   PHYSICAL EXAM:  VITAL SIGNS: ED Triage Vitals [02/17/21 1722]  Enc Vitals Group     BP (!) 164/55     Pulse Rate 69     Resp 20     Temp 98.2 F (36.8 C)     Temp Source Oral     SpO2 97 %     Weight 165 lb 5.5 oz (75 kg)     Height      Head Circumference      Peak Flow      Pain Score 0     Pain Loc      Pain Edu?      Excl.  in [place]?     Constitutional: Alert and oriented. Eyes: Conjunctivae are normal. Head: Atraumatic. Nose: No congestion/rhinnorhea. Mouth/Throat: Mucous membranes are moist. Neck: Normal ROM Cardiovascular: Normal rate, regular rhythm. Grossly normal heart sounds.  2+ radial pulses bilaterally. Respiratory: Normal respiratory effort.  No retractions. Lungs CTAB. Gastrointestinal: Soft and tender to palpation in the left lower quadrant with no rebound or guarding. No distention. Genitourinary: deferred Musculoskeletal: No lower extremity tenderness nor edema. Neurologic:  Normal speech and language. No gross  focal neurologic deficits are appreciated. Skin:  Skin is warm, dry and intact. No rash noted. Psychiatric: Mood and affect are normal. Speech and behavior are normal.  ____________________________________________   LABS (all labs ordered are listed, but only abnormal results are displayed)  Labs Reviewed  BASIC METABOLIC PANEL - Abnormal; Notable for the following components:      Result Value   Sodium 133 (*)    BUN 30 (*)    Creatinine, Ser 1.35 (*)    Calcium 8.6 (*)    GFR, Estimated 53 (*)    All other components within normal limits  CBC WITH DIFFERENTIAL/PLATELET - Abnormal; Notable for the following components:   RBC 4.17 (*)    Hemoglobin 12.6 (*)    HCT 37.6 (*)    Lymphs Abs 0.5 (*)    All other components within normal limits  TROPONIN I (HIGH SENSITIVITY) - Abnormal; Notable for the following components:   Troponin I (High Sensitivity) 19 (*)    All other components within normal limits  RESP PANEL BY RT-PCR (FLU A&B, COVID) [id number]  TROPONIN I (HIGH SENSITIVITY)    PROCEDURES  Procedure(s) performed (including Critical Care):  Procedures   ____________________________________________   INITIAL IMPRESSION / ASSESSMENT AND PLAN / ED COURSE      80 year old male with past medical history of hypertension, hyperlipidemia, CAD status post CABG, and  depression who presents to the ED for reevaluation after being seen in the ED 2 nights ago and diagnosed with diverticulitis.  He states that his pain is improving although he still has some tenderness in his left lower quadrant.  His biggest issue seems to be that he has not eaten anything solid for the past 3 days due to decreased appetite.  He has not had any vomiting or bloody stools, does have slight drop in hemoglobin compared to prior evening and we will trend.  Renal function is slightly worse from previous and we will hydrate with IV fluids.  CT of chest was performed from triage and demonstrates mediastinal mass of unclear etiology, potentially representing malignancy.  There is not appear to be mass-effect on any other structures and no indication for immediate involvement of CT surgery.  Patient did state that he is not interested in surgical intervention, initially stated that he wanted to leave the hospital [place] again this evening.  In further discussion, I was eventually able to convince him to stay for admission for further management of his dehydration, diverticulitis, and mediastinal mass.  Case discussed with NP Burns of oncology, who will follow during admission.  Case discussed with hospitalist for admission.      ____________________________________________   FINAL CLINICAL IMPRESSION(S) / ED DIAGNOSES  Final diagnoses:  Mediastinal mass  Diverticulitis     ED Discharge Orders     None        Note:  This document was prepared using Dragon voice recognition software and may include unintentional dictation errors.    Blake Divine, MD 02/17/21 [phone]

## 2021-02-17 NOTE — ED Notes (Signed)
ED Provider at bedside. 

## 2021-02-17 NOTE — ED Triage Notes (Signed)
Pt to ED [facility] for chest CT, declined chest CT on Sunday. States MD today told him to come to ER for it.  Reports he feels much better than he did on Sunday, declines worsening abd pain, n/v.

## 2021-02-17 NOTE — ED Notes (Addendum)
ERMD at bedside. Pt made aware oncology  will follow up with Pt tomorrow to set up appointment as OP  . Pt   advised to be kept obs , Pt wants to initially leave but changed mind and now wants to stay--- MD made aware .

## 2021-02-18 ENCOUNTER — Telehealth: Payer: Self-pay

## 2021-02-18 ENCOUNTER — Encounter: Payer: Self-pay | Admitting: Internal Medicine

## 2021-02-18 ENCOUNTER — Other Ambulatory Visit: Payer: Self-pay | Admitting: Oncology

## 2021-02-18 DIAGNOSIS — R1900 Intra-abdominal and pelvic swelling, mass and lump, unspecified site: Secondary | ICD-10-CM

## 2021-02-18 LAB — CBC
HCT: 30.6 % — ABNORMAL LOW (ref 39.0–52.0)
Hemoglobin: 10.5 g/dL — ABNORMAL LOW (ref 13.0–17.0)
MCH: 30.3 pg (ref 26.0–34.0)
MCHC: 34.3 g/dL (ref 30.0–36.0)
MCV: 88.4 fL (ref 80.0–100.0)
Platelets: 144 10*3/uL — ABNORMAL LOW (ref 150–400)
RBC: 3.46 MIL/uL — ABNORMAL LOW (ref 4.22–5.81)
RDW: 13.7 % (ref 11.5–15.5)
WBC: 6.6 10*3/uL (ref 4.0–10.5)
nRBC: 0 % (ref 0.0–0.2)

## 2021-02-18 LAB — CREATININE, SERUM
Creatinine, Ser: 1.16 mg/dL (ref 0.61–1.24)
GFR, Estimated: >60 mL/min (ref >60)

## 2021-02-18 LAB — GLUCOSE, CAPILLARY: Glucose-Capillary: 84 mg/dL (ref 70–99)

## 2021-02-18 MED ORDER — ATORVASTATIN CALCIUM 20 MG PO TABS: 80.0000 mg | ORAL_TABLET | Freq: Every day | ORAL | Status: DC

## 2021-02-18 MED ORDER — METOPROLOL TARTRATE 25 MG PO TABS: 25.0000 mg | ORAL_TABLET | Freq: Every day | ORAL | Status: DC

## 2021-02-18 MED ORDER — HYDROCODONE-ACETAMINOPHEN 5-325 MG PO TABS
1.0000 | ORAL_TABLET | ORAL | Status: DC | PRN
  Administered 2021-02-18: 15:00:00 2 via ORAL
  Filled 2021-02-18: qty 2

## 2021-02-18 MED ORDER — ONDANSETRON HCL 4 MG PO TABS
4.0000 mg | ORAL_TABLET | Freq: Four times a day (QID) | ORAL | Status: DC | PRN
  Administered 2021-02-18: 10:00:00 4 mg via ORAL
  Filled 2021-02-18: qty 1

## 2021-02-18 MED ORDER — ENOXAPARIN SODIUM 40 MG/0.4ML IJ SOSY
40.0000 mg | PREFILLED_SYRINGE | INTRAMUSCULAR | Status: DC
  Administered 2021-02-18: 10:00:00 40 mg via SUBCUTANEOUS
  Filled 2021-02-18: qty 0.4

## 2021-02-18 MED ORDER — ACETAMINOPHEN 650 MG RE SUPP: 650.0000 mg | Freq: Four times a day (QID) | RECTAL | Status: DC | PRN

## 2021-02-18 MED ORDER — POLYETHYLENE GLYCOL 3350 17 G PO PACK: 17.0000 g | PACK | Freq: Every day | ORAL | 0 refills | Status: AC

## 2021-02-18 MED ORDER — ALUM & MAG HYDROXIDE-SIMETH 200-200-20 MG/5ML PO SUSP
30.0000 mL | Freq: Four times a day (QID) | ORAL | Status: DC | PRN
  Administered 2021-02-18: 04:00:00 30 mL via ORAL
  Filled 2021-02-18: qty 30

## 2021-02-18 MED ORDER — DIAZEPAM 5 MG PO TABS: 5.0000 mg | ORAL_TABLET | Freq: Four times a day (QID) | ORAL | Status: DC | PRN

## 2021-02-18 MED ORDER — ONDANSETRON HCL 4 MG/2ML IJ SOLN: 4.0000 mg | Freq: Four times a day (QID) | INTRAMUSCULAR | Status: DC | PRN

## 2021-02-18 MED ORDER — MORPHINE SULFATE (PF) 2 MG/ML IV SOLN: 2.0000 mg | INTRAVENOUS | Status: DC | PRN

## 2021-02-18 MED ORDER — DIAZEPAM 5 MG PO TABS
5.0000 mg | ORAL_TABLET | Freq: Four times a day (QID) | ORAL | Status: AC | PRN
  Administered 2021-02-18: 07:00:00 5 mg via ORAL
  Filled 2021-02-18: qty 1

## 2021-02-18 MED ORDER — ACETAMINOPHEN 325 MG PO TABS: 650.0000 mg | ORAL_TABLET | Freq: Four times a day (QID) | ORAL | Status: DC | PRN

## 2021-02-18 MED ORDER — INFLUENZA VAC A&B SA ADJ QUAD 0.5 ML IM PRSY: 0.5000 mL | PREFILLED_SYRINGE | INTRAMUSCULAR | Status: DC

## 2021-02-18 MED ORDER — SODIUM CHLORIDE 0.9 % IV SOLN: INTRAVENOUS | Status: DC

## 2021-02-18 MED ORDER — OXYCODONE-ACETAMINOPHEN 5-325 MG PO TABS: 1.0000 | ORAL_TABLET | ORAL | Status: DC | PRN

## 2021-02-18 MED ORDER — POLYETHYLENE GLYCOL 3350 17 G PO PACK
17.0000 g | PACK | Freq: Every day | ORAL | Status: DC
Start: 2021-02-18 — End: 2021-02-18
  Administered 2021-02-18: 10:00:00 17 g via ORAL
  Filled 2021-02-18: qty 1

## 2021-02-18 NOTE — TOC Transition Note (Signed)
Transition of Care [facility]) - CM/SW Discharge Note   Patient Details  Name: [name] [name] MRN: [id number] Date of Birth: [birth year]  Transition of Care [facility]) CM/SW Contact:  Kerin Salen, RN Phone Number: 02/18/2021, 4:33 PM   Clinical Narrative: To discharge home, [facility] scheduled to come at 4:38pm. Ride waiver signed and placed in the chart.     Final next level of care: Home/Self Care Barriers to Discharge: Barriers Resolved   Patient Goals and CMS Choice Patient states their goals for this hospitalization and ongoing recovery are:: To return home      Discharge Placement                Patient to be transferred to facility by: [facility]   Patient and family notified of of transfer: 02/18/21  Discharge Plan and Services                                     Social Determinants of Health (SDOH) Interventions     Readmission Risk Interventions No flowsheet data found.

## 2021-02-18 NOTE — Telephone Encounter (Signed)
Patient is planned to be discharged from hospital today.  [name] Alstrom, NP would like for him to be scheduled for biopsy of mediastinal mass as outpatient.    Order entered by Sonia Baller and request has been faxed to specialty scheduling.

## 2021-02-18 NOTE — Discharge Summary (Signed)
Physician Discharge Summary  [name] [name] HYQ:[id number] DOB: 24-May-1940 DOA: 02/17/2021  PCP: Teodoro Spray, MD  Admit date: 02/17/2021 Discharge date: 02/18/2021  Admitted From: Home Disposition: Home  Recommendations for Outpatient Follow-up:  Follow up with PCP in 1-2 weeks Schedule follow-up with your pain management clinic Keep up your schedule follow-up with oncology.  Home Health: N/A Equipment/Devices: N/A  Discharge Condition: Stable CODE STATUS: Full code Diet recommendation: Low-salt diet, liquid and soft diet  Discharge summary: 80 year old gentleman with history of hypertension, hyperlipidemia, coronary artery disease status post CABG and depression initially presented to the ER with abdominal pain and found to have uncomplicated diverticulitis, incidental mediastinal mass.  He was advised to stay in the hospital and get dedicated chest CT scan, however he left AMA, returned back to ER next day to complete his studies.  Was agreeable to admission and work-up.  He started on IV fluids and IV antibiotics for diverticulitis and oncology consulted.  Acute diverticulitis: Uncomplicated.  Normal bowel function.  Eating regular soft diet.  Treated with IV Unasyn, already started on Augmentin from ER.  Never had a colonoscopy and does not like to have one. With adequate symptom control, he can complete his oral antibiotic course.  Currently needing oncology work-up for midsternal mass.  Anterior midsternal tumor: Inpatient CT-guided biopsy was recommended.  Patient was adamant on going home because of his back pain that he needs to lay on his own couch and take his own oxycodone.  Seen by oncology in the hospital, patient not agreeable to stay so outpatient CT-guided biopsy was scheduled and follow-up as scheduled.  Other chronic medical issues including hypertension, coronary artery disease are stable.  He is on chronic pain management, he had run out of his pain medications  and was prescribed 20 tablets of oxycodone 10 mg by ER doctor on 12/12.  She will use this for acute pain and follow-up with his pain management clinic.  Patient was extensively counseled regarding keeping up follow-up, images were discussed with him and importance of follow-up discussed.  He is agreeable.  Discharge Diagnoses:  Principal Problem:   Acute diverticulitis Active Problems:   CAD (coronary artery disease)   Essential hypertension   S/P CABG (coronary artery bypass graft)   RBBB   AAA (abdominal aortic aneurysm)   Anterior mediastinal tumor    Discharge Instructions  Discharge Instructions     Call MD for:  persistant nausea and vomiting   Complete by: As directed    Call MD for:  severe uncontrolled pain   Complete by: As directed    Diet - low sodium heart healthy   Complete by: As directed    Soft , liquid diet   Discharge instructions   Complete by: As directed    Follow up with Oncology clinic as scheduled   Increase activity slowly   Complete by: As directed       Allergies as of 02/18/2021       Reactions   Adhesive [tape] Other (See Comments)   bandaids cause petechiae when removed   Duloxetine Other (See Comments)   Urinary retention        Medication List     STOP taking these medications    diazepam 5 MG tablet Commonly known as: VALIUM       TAKE these medications    amoxicillin-clavulanate 875-125 MG tablet Commonly known as: Augmentin Take 1 tablet by mouth 2 (two) times daily.   atorvastatin 80 MG tablet Commonly  known as: LIPITOR Take 80 mg by mouth daily.   Baclofen 5 MG Tabs Take 0.5-1 tablets by mouth 2 (two) times daily as needed.   busPIRone 10 MG tablet Commonly known as: BUSPAR Take 20 mg by mouth 2 (two) times daily. What changed: Another medication with the same name was removed. Continue taking this medication, and follow the directions you see here.   hydrOXYzine 25 MG tablet Commonly known as:  ATARAX Take 25 mg by mouth daily.   lisinopril 30 MG tablet Commonly known as: ZESTRIL Take 30 mg by mouth every evening.   metoprolol tartrate 25 MG tablet Commonly known as: LOPRESSOR Take 25 mg by mouth daily.   mirtazapine 45 MG tablet Commonly known as: REMERON Take 45 mg by mouth at bedtime. What changed: Another medication with the same name was removed. Continue taking this medication, and follow the directions you see here.   ondansetron 4 MG disintegrating tablet Commonly known as: ZOFRAN-ODT Take 1 tablet (4 mg total) by mouth every 8 (eight) hours as needed for nausea or vomiting.   Oxycodone HCl 10 MG Tabs Take 1 tablet (10 mg total) by mouth every 6 (six) hours as needed for severe pain.   polyethylene glycol 17 g packet Commonly known as: MIRALAX / GLYCOLAX Take 17 g by mouth daily. Start taking on: February 19, 2021        Follow-up Information     [facility]   Specialty: Emergency Medicine Why: If symptoms worsen Contact information: [place] [id number] [place] [phone]        Schedule an appointment as soon as possible for a visit  with Lloyd Huger, MD.   Specialty: Oncology Contact information: [place] [address] [phone]                Allergies  Allergen Reactions   Adhesive [Tape] Other (See Comments)    bandaids cause petechiae when removed   Duloxetine Other (See Comments)    Urinary retention    Consultations: Oncology   Procedures/Studies: CT Abdomen Pelvis Wo Contrast  Result Date: 02/15/2021 CLINICAL DATA:  Left lower quadrant abdominal pain, nausea, vomiting EXAM: CT ABDOMEN AND PELVIS WITHOUT CONTRAST TECHNIQUE: Multidetector CT imaging of the abdomen and pelvis was performed following the standard protocol without IV contrast. COMPARISON:  07/21/2015 FINDINGS: Lower chest: There appears to  be an anterior mediastinal mass anterior to the ascending aorta measuring 5.7 cm. This area was not imaged on prior abdominal CT. Recommend further evaluation with chest CT. Coronary artery and aortic calcifications. Heart is normal size. No effusions. Hepatobiliary: Small cyst in the right hepatic lobe measures 17 mm and is unchanged. Gallbladder unremarkable. No biliary ductal dilatation. Pancreas: No focal abnormality or ductal dilatation. Spleen: Calcifications in the spleen compatible with old granulomatous disease. Adrenals/Urinary Tract: Small parapelvic cysts in the lower pole of the left kidney. No hydronephrosis bilaterally. No renal or ureteral stones. Adrenal glands unremarkable. Small left posterior bladder wall diverticulum. Stomach/Bowel: Diffuse colonic diverticulosis. Inflammatory stranding around the proximal sigmoid colon compatible with active diverticulitis. Large diverticulum off the 2nd portion of the duodenum. This is unchanged. No evidence of bowel obstruction. Several small bowel loops appear to be thick walled in the right mid and lower abdomen concerning for enteritis. Normal appendix. Vascular/Lymphatic: 4 cm infrarenal abdominal aortic aneurysm. Aortic atherosclerosis. No adenopathy. Reproductive: Prostate enlargement. Other: Small amount of free fluid in the pelvis and  adjacent to the liver and spleen. Musculoskeletal: Degenerative changes in the lumbar spine. No acute bony abnormality. 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 Recommend further evaluation with chest CT, preferably with IV contrast if patient is able to receive contrast. There appear to be several mildly thickened small bowel loops in the right abdomen concerning for enteritis. Diffuse colonic diverticulosis. Inflammatory stranding around the proximal sigmoid colon compatible with active diverticulitis. Small amount of free fluid in the abdomen and pelvis.  4 cm abdominal aortic aneurysm. Recommend follow-up every 12 months and vascular consultation. This recommendation follows ACR consensus guidelines: White Paper of the ACR Incidental Findings Committee II on Vascular Findings. J Am Coll Radiol 2013; 10:789-794. Electronically Signed   By: Rolm Baptise M.D.   On: 02/15/2021 23:56   CT Chest W Contrast  Result Date: 02/17/2021 CLINICAL DATA:  Central chest mass EXAM: CT CHEST WITH CONTRAST TECHNIQUE: Multidetector CT imaging of the chest was performed during intravenous contrast administration. CONTRAST:  67mL OMNIPAQUE IOHEXOL 350 MG/ML SOLN COMPARISON:  CT 02/15/2021 FINDINGS: Cardiovascular: Post CABG changes. Tortuous thoracic aortic arch with moderate aortic atherosclerosis. Mild aneurysmal dilatation of the ascending aorta up to 4 cm. No dissection is seen. Common origin of the left common carotid and right brachiocephalic vessels. Borderline cardiomegaly. No pericardial effusion. Mediastinum/Nodes: Midline trachea. No thyroid mass. Mostly solid anterior mediastinal mass measuring 6.4 by 4.7 by 5.6 cm anterior to the aortic root and ascending aorta. Mass appears to contain small cystic spaces. Esophagus within normal limits. No suspicious lymph nodes. Lungs/Pleura: No acute consolidation, pleural effusion, or pneumothorax. Mild subpleural reticulation consistent with scarring. Punctate 2-3 mm peripheral right upper lobe lung nodules, series 3, image 59 and punctate subpleural right lower lobe lung nodule, series 3, image 97. Upper Abdomen: Cyst within the liver. Exophytic cyst at the anterolateral spleen. Small amount of free fluid in the upper quadrants. Musculoskeletal: Post sternotomy changes. No acute osseous abnormality. IMPRESSION: 1. 6.4 x 4.7 x 5.6 cm indeterminate solid mass within the anterior mediastinum; mass could represent thymoma, thymic carcinoma, or potential lymphoma. Cardiothoracic surgical consultation is recommended. 2. Post CABG  changes. Ascending aortic diameter up to 4 cm. Recommend annual imaging followup by CTA or MRA. This recommendation follows 2010 ACCF/AHA/AATS/ACR/ASA/SCA/SCAI/SIR/STS/SVM Guidelines for the Diagnosis and Management of Patients with Thoracic Aortic Disease. Circulation. 2010; 121: K998-P382. Aortic aneurysm NOS (ICD10-I71.9) 3. Punctate pulmonary nodules measuring up to 3 mm. No follow-up needed if patient is low-risk (and has no known or suspected primary neoplasm). Non-contrast chest CT can be considered in 12 months if patient is high-risk. This recommendation follows the consensus statement: Guidelines for Management of Incidental Pulmonary Nodules Detected on CT Images: From the Fleischner Society 2017; Radiology 2017; 284:228-243. 4. Trace free fluid in the bilateral upper quadrants Aortic aneurysm NOS (ICD10-I71.9).Aortic Atherosclerosis (ICD10-I70.0). Electronically Signed   By: Donavan Foil M.D.   On: 02/17/2021 18:34   (Echo, Carotid, EGD, Colonoscopy, ERCP)    Subjective: Since patient was seen in the morning, he continued to have conversation about going home.  He ate regular diet and walking around in the hallway asking to be discharged.  He had discussed with oncology and he will keep of the appointment.   Discharge Exam: Vitals:   02/18/21 0759 02/18/21 1130  BP: (!) 158/57 131/64  Pulse: 61 62  Resp: 20 20  Temp: 98.1 F (36.7 C) 98.8 F (37.1 C)  SpO2: 100% 96%  Vitals:   02/18/21 0345 02/18/21 0349 02/18/21 0759 02/18/21 1130  BP: (!) 172/71 (!) 157/65 (!) 158/57 131/64  Pulse: 67 65 61 62  Resp:   20 20  Temp: 97.6 F (36.4 C) 97.8 F (36.6 C) 98.1 F (36.7 C) 98.8 F (37.1 C)  TempSrc: Oral Oral  Oral  SpO2: 98% 99% 100% 96%  Weight:      Height:        General: Pt is alert, awake, not in acute distress Cardiovascular: RRR, S1/S2 +, no rubs, no gallops Respiratory: CTA bilaterally, no wheezing, no rhonchi Abdominal: Soft, NT, ND, bowel sounds +, no  tenderness.  No rigidity or guarding. Extremities: no edema, no cyanosis    The results of significant diagnostics from this hospitalization (including imaging, microbiology, ancillary and laboratory) are listed below for reference.     Microbiology: Recent Results (from the past 240 hour(s))  Resp Panel by RT-PCR (Flu A&B, Covid) Nasopharyngeal Swab     Status: None   Collection Time: 02/17/21  8:45 PM   Specimen: Nasopharyngeal Swab; Nasopharyngeal(NP) swabs in vial transport medium  Result Value Ref Range Status   SARS Coronavirus 2 by RT PCR NEGATIVE NEGATIVE Final    Comment: (NOTE) SARS-CoV-2 target nucleic acids are NOT DETECTED.  The SARS-CoV-2 RNA is generally detectable in upper respiratory specimens during the acute phase of infection. The lowest concentration of SARS-CoV-2 viral copies this assay can detect is 138 copies/mL. A negative result does not preclude SARS-Cov-2 infection and should not be used as the sole basis for treatment or other patient management decisions. A negative result may occur with  improper specimen collection/handling, submission of specimen other than nasopharyngeal swab, presence of viral mutation(s) within the areas targeted by this assay, and inadequate number of viral copies(<138 copies/mL). A negative result must be combined with clinical observations, patient history, and epidemiological information. The expected result is Negative.  Fact Sheet for Patients:  EntrepreneurPulse.com.au  Fact Sheet for Healthcare Providers:  IncredibleEmployment.be  This test is no t yet approved or cleared by the [place] FDA and  has been authorized for detection and/or diagnosis of SARS-CoV-2 by FDA under an Emergency Use Authorization (EUA). This EUA will remain  in effect (meaning this test can be used) for the duration of the COVID-19 declaration under Section 564(b)(1) of the Act, 21 U.S.C.section  360bbb-3(b)(1), unless the authorization is terminated  or revoked sooner.       Influenza A by PCR NEGATIVE NEGATIVE Final   Influenza B by PCR NEGATIVE NEGATIVE Final    Comment: (NOTE) The Xpert Xpress SARS-CoV-2/FLU/RSV plus assay is intended as an aid in the diagnosis of influenza from Nasopharyngeal swab specimens and should not be used as a sole basis for treatment. Nasal washings and aspirates are unacceptable for Xpert Xpress SARS-CoV-2/FLU/RSV testing.  Fact Sheet for Patients: EntrepreneurPulse.com.au  Fact Sheet for Healthcare Providers: IncredibleEmployment.be  This test is not yet approved or cleared by the [place] FDA and has been authorized for detection and/or diagnosis of SARS-CoV-2 by FDA under an Emergency Use Authorization (EUA). This EUA will remain in effect (meaning this test can be used) for the duration of the COVID-19 declaration under Section 564(b)(1) of the Act, 21 U.S.C. section 360bbb-3(b)(1), unless the authorization is terminated or revoked.  Performed at [facility], [address] [place], [place] [address]      Labs: BNP (last 3 results) No results for input(s): BNP in the last 8760 hours. Basic  Metabolic Panel: Recent Labs  Lab 02/15/21 2107 02/17/21 1723 02/18/21 0613  NA 135 133*  --   K 3.8 4.1  --   CL 104 104  --   CO2 22 23  --   GLUCOSE 132* 95  --   BUN 24* 30*  --   CREATININE 1.18 1.35* 1.16  CALCIUM 9.5 8.6*  --    Liver Function Tests: Recent Labs  Lab 02/15/21 2107  AST 14*  ALT 14  ALKPHOS 69  BILITOT 1.1  PROT 7.2  ALBUMIN 4.0   Recent Labs  Lab 02/15/21 2107  LIPASE 24   No results for input(s): AMMONIA in the last 168 hours. CBC: Recent Labs  Lab 02/15/21 2107 02/17/21 1723 02/18/21 0613  WBC 12.2* 8.5 6.6  NEUTROABS  --  7.4  --   HGB 14.7 12.6* 10.5*  HCT 42.3 37.6* 30.6*  MCV 88.3 90.2 88.4  PLT 207 177 144*   Cardiac  Enzymes: No results for input(s): CKTOTAL, CKMB, CKMBINDEX, TROPONINI in the last 168 hours. BNP: Invalid input(s): POCBNP CBG: Recent Labs  Lab 02/18/21 0350  GLUCAP 84   D-Dimer No results for input(s): DDIMER in the last 72 hours. Hgb A1c No results for input(s): HGBA1C in the last 72 hours. Lipid Profile No results for input(s): CHOL, HDL, LDLCALC, TRIG, CHOLHDL, LDLDIRECT in the last 72 hours. Thyroid function studies No results for input(s): TSH, T4TOTAL, T3FREE, THYROIDAB in the last 72 hours.  Invalid input(s): FREET3 Anemia work up No results for input(s): VITAMINB12, FOLATE, FERRITIN, TIBC, IRON, RETICCTPCT in the last 72 hours. Urinalysis    Component Value Date/Time   COLORURINE STRAW (A) 07/21/2015 0232   APPEARANCEUR CLEAR (A) 07/21/2015 0232   APPEARANCEUR Clear 03/19/2013 1549   LABSPEC 1.005 07/21/2015 0232   LABSPEC 1.012 03/19/2013 1549   PHURINE 5.0 07/21/2015 0232   GLUCOSEU NEGATIVE 07/21/2015 0232   GLUCOSEU Negative 03/19/2013 1549   HGBUR 1+ (A) 07/21/2015 0232   BILIRUBINUR NEGATIVE 07/21/2015 0232   BILIRUBINUR Negative 03/19/2013 1549   KETONESUR NEGATIVE 07/21/2015 0232   PROTEINUR NEGATIVE 07/21/2015 0232   NITRITE NEGATIVE 07/21/2015 0232   LEUKOCYTESUR NEGATIVE 07/21/2015 0232   LEUKOCYTESUR Negative 03/19/2013 1549   Sepsis Labs Invalid input(s): PROCALCITONIN,  WBC,  LACTICIDVEN Microbiology Recent Results (from the past 240 hour(s))  Resp Panel by RT-PCR (Flu A&B, Covid) Nasopharyngeal Swab     Status: None   Collection Time: 02/17/21  8:45 PM   Specimen: Nasopharyngeal Swab; Nasopharyngeal(NP) swabs in vial transport medium  Result Value Ref Range Status   SARS Coronavirus 2 by RT PCR NEGATIVE NEGATIVE Final    Comment: (NOTE) SARS-CoV-2 target nucleic acids are NOT DETECTED.  The SARS-CoV-2 RNA is generally detectable in upper respiratory specimens during the acute phase of infection. The lowest concentration of SARS-CoV-2  viral copies this assay can detect is 138 copies/mL. A negative result does not preclude SARS-Cov-2 infection and should not be used as the sole basis for treatment or other patient management decisions. A negative result may occur with  improper specimen collection/handling, submission of specimen other than nasopharyngeal swab, presence of viral mutation(s) within the areas targeted by this assay, and inadequate number of viral copies(<138 copies/mL). A negative result must be combined with clinical observations, patient history, and epidemiological information. The expected result is Negative.  Fact Sheet for Patients:  EntrepreneurPulse.com.au  Fact Sheet for Healthcare Providers:  IncredibleEmployment.be  This test is no t yet approved or cleared by  the [employer] and  has been authorized for detection and/or diagnosis of SARS-CoV-2 by FDA under an Emergency Use Authorization (EUA). This EUA will remain  in effect (meaning this test can be used) for the duration of the COVID-19 declaration under Section 564(b)(1) of the Act, 21 U.S.C.section 360bbb-3(b)(1), unless the authorization is terminated  or revoked sooner.       Influenza A by PCR NEGATIVE NEGATIVE Final   Influenza B by PCR NEGATIVE NEGATIVE Final    Comment: (NOTE) The Xpert Xpress SARS-CoV-2/FLU/RSV plus assay is intended as an aid in the diagnosis of influenza from Nasopharyngeal swab specimens and should not be used as a sole basis for treatment. Nasal washings and aspirates are unacceptable for Xpert Xpress SARS-CoV-2/FLU/RSV testing.  Fact Sheet for Patients: EntrepreneurPulse.com.au  Fact Sheet for Healthcare Providers: IncredibleEmployment.be  This test is not yet approved or cleared by the [place] FDA and has been authorized for detection and/or diagnosis of SARS-CoV-2 by FDA under an Emergency Use Authorization  (EUA). This EUA will remain in effect (meaning this test can be used) for the duration of the COVID-19 declaration under Section 564(b)(1) of the Act, 21 U.S.C. section 360bbb-3(b)(1), unless the authorization is terminated or revoked.  Performed at [facility], [address] [place], [place] [address]      Time coordinating discharge:  40 minutes  SIGNED:   Barb Merino, MD  [facility] 02/18/2021, 4:16 PM

## 2021-02-18 NOTE — Progress Notes (Addendum)
Patient awakened. Waved at staff through the door. C/o feeling dizzy, nauseated, and heartburn. Patient states that he cant get his thoughts together and that he is hard to answer questions. Patient appears very anxious. B/p wnl. Sats 100%on room air. Blood sugar 84. Patient can have clear liquid diet.  Ginger ale offered and accepted. Will continue to observe.   Assisted patient to bathroom, maalox given for patient's heartburn at this time. Mouthwash provided while patient up brushing his teeth. After urinating in toilet patient states he feel less dizzy. Sitting up on side of bed at this time.   NS infusing at 100cc/hr to right forearm. Awaiting pharmacy to send antibiotics.  Patient sitting up in bed. Says his heartburn is gone and he is feeling much better. Requested another ginger ale. No distress at this time. IV antibiotics infusing at this time.  Patient has a calm demeanor, talkative and thankful.

## 2021-02-18 NOTE — TOC Initial Note (Signed)
Transition of Care [facility]) - Initial/Assessment Note    Patient Details  Name: [name] [name] MRN: [id number] Date of Birth: 1940-10-06  Transition of Care [facility] CM/SW Contact:    Kerin Salen, RN Phone Number: 02/18/2021, 3:35 PM  Clinical Narrative: Spoke with patient who was alert and oriented x4. Indicates he lives alone, no PCP, however get pain medication from Psychiatrist Dr. Heide Scales. Independent with ADL's, cooking however use online shopping. Use [facility] Synetta Fail when needed. Use cane at times for ambulation, voices only needing Oxycodone for patient due to history of back pain. TOC to continue to track for discharge needs.                  Expected Discharge Plan: Home/Self Care Barriers to Discharge: Continued Medical Work up   Patient Goals and CMS Choice Patient states their goals for this hospitalization and ongoing recovery are:: To return home      Expected Discharge Plan and Services Expected Discharge Plan: Home/Self Care       Living arrangements for the past 2 months: Apartment                                      Prior Living Arrangements/Services Living arrangements for the past 2 months: Apartment Lives with:: Self Patient language and need for interpreter reviewed:: Yes Do you feel safe going back to the place where you live?: Yes      Need for Family Participation in Patient Care: No (Comment) Care giver support system in place?: Yes (comment)   Criminal Activity/Legal Involvement Pertinent to Current Situation/Hospitalization: No - Comment as needed  Activities of Daily Living Home Assistive Devices/Equipment: None ADL Screening (condition at time of admission) Patient's cognitive ability adequate to safely complete daily activities?: Yes Is the patient deaf or have difficulty hearing?: No Does the patient have difficulty seeing, even when wearing glasses/contacts?: No Does the patient have difficulty concentrating, remembering,  or making decisions?: No Patient able to express need for assistance with ADLs?: Yes Does the patient have difficulty dressing or bathing?: No Independently performs ADLs?: Yes (appropriate for developmental age) Does the patient have difficulty walking or climbing stairs?: No Weakness of Legs: Both Weakness of Arms/Hands: Both  Permission Sought/Granted Permission sought to share information with : Case Manager                Emotional Assessment Appearance:: Appears stated age Attitude/Demeanor/Rapport: Guarded Affect (typically observed): Guarded Orientation: : Oriented to Self, Oriented to Place, Oriented to  Time, Oriented to Situation Alcohol / Substance Use: Not Applicable Psych Involvement: No (comment)  Admission diagnosis:  Diverticulitis [K57.92] 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] Acute diverticulitis [K57.92] Patient Active Problem List   Diagnosis Date Noted   Acute diverticulitis 02/17/2021   RBBB 02/17/2021   AAA (abdominal aortic aneurysm) 02/17/2021   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 [place] [address] Phone: [phone] Fax: [phone]  Social Determinants of Health (SDOH) Interventions    Readmission Risk Interventions No flowsheet data found.

## 2021-02-18 NOTE — Progress Notes (Signed)
Orders in for Ct guided Biopsy.   [name] Casa, NP 02/18/2021 4:10 PM

## 2021-02-18 NOTE — Consult Note (Signed)
[facility]  [phone] [phone]  ID: Berlinda Last OB: 07-Mar-1941  MR#: [id number]  VZC#:[id number]  Patient Care Team: Teodoro Spray, MD as PCP - General (Cardiology)  CHIEF COMPLAINT: Mediastinal mass    INTERVAL HISTORY: Mr. Renner is an 80 year old male with past medical history of hypertension, hyperlipidemia, CAD, status post CABG and depression who was brought in by EMS to the emergency room for abdominal pain and found to have uncomplicated diverticulitis.  Incidental mediastinal mass was discovered and it was recommended he undergo CT imaging of the mass however he opted to go home AMA.  He returned back to the emergency room on 02/17/2021 for similar symptoms and for imaging that was recommended several days prior.  He was agreeable for admission for work-up for mediastinal mass and IV antibiotics for diverticulitis.  Reports overall feeling poorly.  Has had abdominal pain on and off since this past Saturday night.  Reports that the staff here at the hospital are not giving him enough pain medication.  Describes the abdominal pain as cramping although it is improved since he started antibiotics.  He also reports acid reflux and chronic lower back and hip pain.  He denies constipation or diarrhea.    REVIEW OF SYSTEMS:   Review of Systems  Gastrointestinal:  Positive for abdominal pain and heartburn.  Psychiatric/Behavioral:  The patient has insomnia.    As per HPI. Otherwise, a complete review of systems is negative.  PAST MEDICAL HISTORY: Past Medical History:  Diagnosis Date   Anxiety    Arthritis    right hand, lower back   Coronary artery disease    Degenerative disc disease, lumbar    Depression    Hyperlipidemia    Hypertension    Lumbar paraspinal muscle spasm    Lumbar spondylosis    Myocardial infarction ([place]) 2014   NSTEMI   Phlebitis of leg, left    s/p vein harvest for by-pass graft   Radiculitis, lumbosacral     Vitamin D deficiency     PAST SURGICAL HISTORY: Past Surgical History:  Procedure Laterality Date   CORONARY ARTERY BYPASS GRAFT  2014    4 vessel   CYST EXCISION     anal cyst   ESOPHAGOGASTRODUODENOSCOPY     MASS EXCISION Right 02/19/2015   Procedure: EXCISION MASS OF SOFT TISSUE RIGHT HAND;  Surgeon: Corky Mull, MD;  Location: [facility];  Service: Orthopedics;  Laterality: Right;  LOCAL PER DR POGGI   TONSILLECTOMY     age 89    FAMILY HISTORY: Family History  Problem Relation Age of Onset   Alcohol abuse Other    Diabetes Neg Hx    CAD Neg Hx     ADVANCED DIRECTIVES (Y/N):  @ADVDIR @  HEALTH MAINTENANCE: Social History   Tobacco Use   Smoking status: Every Day    Packs/day: 0.75    Years: 28.00    Pack years: 21.00    Types: Cigarettes   Smokeless tobacco: Never  Substance Use Topics   Alcohol use: No   Drug use: Yes    Comment: prescribed oxy for chronic back pain     Colonoscopy:  PAP:  Bone density:  Lipid panel:  Allergies  Allergen Reactions   Adhesive [Tape] Other (See Comments)    bandaids cause petechiae when removed   Duloxetine Other (See Comments)    Urinary retention    Current Facility-Administered Medications  Medication Dose Route Frequency Provider Last Rate Last  Admin   0.9 %  sodium chloride infusion   Intravenous Continuous Athena Masse, MD 100 mL/hr at 02/18/21 0413 New Bag at 02/18/21 0413   acetaminophen (TYLENOL) tablet 650 mg  650 mg Oral Q6H PRN Athena Masse, MD       Or   acetaminophen (TYLENOL) suppository 650 mg  650 mg Rectal Q6H PRN Athena Masse, MD       alum & mag hydroxide-simeth (MAALOX/MYLANTA) 200-200-20 MG/5ML suspension 30 mL  30 mL Oral Q6H PRN Athena Masse, MD   30 mL at 02/18/21 0407   Ampicillin-Sulbactam (UNASYN) 3 g in sodium chloride 0.9 % 100 mL IVPB  3 g Intravenous Q6H Virl Cagey E, RPH 200 mL/hr at 02/18/21 1016 3 g at 02/18/21 1016   atorvastatin (LIPITOR) tablet 80 mg   80 mg Oral QHS Renda Rolls, RPH       baclofen (LIORESAL) tablet 5 mg  5 mg Oral BID PRN Athena Masse, MD       busPIRone (BUSPAR) tablet 20 mg  20 mg Oral BID Athena Masse, MD   20 mg at 02/18/21 1004   diazepam (VALIUM) tablet 5 mg  5 mg Oral Q6H PRN Barb Merino, MD       enoxaparin (LOVENOX) injection 40 mg  40 mg Subcutaneous Q24H Judd Gaudier V, MD   40 mg at 02/18/21 1006   HYDROcodone-acetaminophen (NORCO/VICODIN) 5-325 MG per tablet 1-2 tablet  1-2 tablet Oral Q4H PRN Athena Masse, MD       hydrOXYzine (ATARAX) tablet 25 mg  25 mg Oral Daily Judd Gaudier V, MD   25 mg at 02/18/21 1004   [START ON 02/19/2021] influenza vaccine adjuvanted (FLUAD) injection 0.5 mL  0.5 mL Intramuscular Tomorrow-1000 Athena Masse, MD       metoprolol tartrate (LOPRESSOR) tablet 25 mg  25 mg Oral QHS Belue, Alver Sorrow, RPH       mirtazapine (REMERON) tablet 45 mg  45 mg Oral QHS Judd Gaudier V, MD       morphine 2 MG/ML injection 2 mg  2 mg Intravenous Q2H PRN Athena Masse, MD       nicotine (NICODERM CQ - dosed in mg/24 hours) patch 21 mg  21 mg Transdermal Once Blake Divine, MD   21 mg at 02/17/21 2010   ondansetron (ZOFRAN) tablet 4 mg  4 mg Oral Q6H PRN Athena Masse, MD   4 mg at 02/18/21 1007   Or   ondansetron (ZOFRAN) injection 4 mg  4 mg Intravenous Q6H PRN Athena Masse, MD       oxyCODONE (Oxy IR/ROXICODONE) immediate release tablet 10 mg  10 mg Oral Q6H PRN Athena Masse, MD       polyethylene glycol (MIRALAX / GLYCOLAX) packet 17 g  17 g Oral Daily Barb Merino, MD   17 g at 02/18/21 1004    OBJECTIVE: Vitals:   02/18/21 0759 02/18/21 1130  BP: (!) 158/57 131/64  Pulse: 61 62  Resp: 20 20  Temp: 98.1 F (36.7 C) 98.8 F (37.1 C)  SpO2: 100% 96%     Body mass index is 22.57 kg/m.    ECOG FS:1 - Symptomatic but completely ambulatory  Physical Exam Constitutional:      Appearance: He is obese.  Cardiovascular:     Rate and Rhythm: Normal rate and  regular rhythm.  Abdominal:     General: There is distension.  Tenderness: There is generalized abdominal tenderness.  Neurological:     Mental Status: He is alert.     LAB RESULTS:  Lab Results  Component Value Date   NA 133 (L) 02/17/2021   K 4.1 02/17/2021   CL 104 02/17/2021   CO2 23 02/17/2021   GLUCOSE 95 02/17/2021   BUN 30 (H) 02/17/2021   CREATININE 1.16 02/18/2021   CALCIUM 8.6 (L) 02/17/2021   PROT 7.2 02/15/2021   ALBUMIN 4.0 02/15/2021   AST 14 (L) 02/15/2021   ALT 14 02/15/2021   ALKPHOS 69 02/15/2021   BILITOT 1.1 02/15/2021   GFRNONAA >60 02/18/2021   GFRAA >60 03/02/2019    Lab Results  Component Value Date   WBC 6.6 02/18/2021   NEUTROABS 7.4 02/17/2021   HGB 10.5 (L) 02/18/2021   HCT 30.6 (L) 02/18/2021   MCV 88.4 02/18/2021   PLT 144 (L) 02/18/2021     STUDIES: CT Abdomen Pelvis Wo Contrast  Result Date: 02/15/2021 CLINICAL DATA:  Left lower quadrant abdominal pain, nausea, vomiting EXAM: CT ABDOMEN AND PELVIS WITHOUT CONTRAST TECHNIQUE: Multidetector CT imaging of the abdomen and pelvis was performed following the standard protocol without IV contrast. COMPARISON:  07/21/2015 FINDINGS: Lower chest: There appears to be an anterior mediastinal mass anterior to the ascending aorta measuring 5.7 cm. This area was not imaged on prior abdominal CT. Recommend further evaluation with chest CT. Coronary artery and aortic calcifications. Heart is normal size. No effusions. Hepatobiliary: Small cyst in the right hepatic lobe measures 17 mm and is unchanged. Gallbladder unremarkable. No biliary ductal dilatation. Pancreas: No focal abnormality or ductal dilatation. Spleen: Calcifications in the spleen compatible with old granulomatous disease. Adrenals/Urinary Tract: Small parapelvic cysts in the lower pole of the left kidney. No hydronephrosis bilaterally. No renal or ureteral stones. Adrenal glands unremarkable. Small left posterior bladder wall  diverticulum. Stomach/Bowel: Diffuse colonic diverticulosis. Inflammatory stranding around the proximal sigmoid colon compatible with active diverticulitis. Large diverticulum off the 2nd portion of the duodenum. This is unchanged. No evidence of bowel obstruction. Several small bowel loops appear to be thick walled in the right mid and lower abdomen concerning for enteritis. Normal appendix. Vascular/Lymphatic: 4 cm infrarenal abdominal aortic aneurysm. Aortic atherosclerosis. No adenopathy. Reproductive: Prostate enlargement. Other: Small amount of free fluid in the pelvis and adjacent to the liver and spleen. Musculoskeletal: Degenerative changes in the lumbar spine. No acute bony abnormality. 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 Recommend further evaluation with chest CT, preferably with IV contrast if patient is able to receive contrast. There appear to be several mildly thickened small bowel loops in the right abdomen concerning for enteritis. Diffuse colonic diverticulosis. Inflammatory stranding around the proximal sigmoid colon compatible with active diverticulitis. Small amount of free fluid in the abdomen and pelvis. 4 cm abdominal aortic aneurysm. Recommend follow-up every 12 months and vascular consultation. This recommendation follows ACR consensus guidelines: White Paper of the ACR Incidental Findings Committee II on Vascular Findings. J Am Coll Radiol 2013; 10:789-794. Electronically Signed   By: Rolm Baptise M.D.   On: 02/15/2021 23:56   CT Chest W Contrast  Result Date: 02/17/2021 CLINICAL DATA:  Central chest mass EXAM: CT CHEST WITH CONTRAST TECHNIQUE: Multidetector CT imaging of the chest was performed during intravenous contrast administration. CONTRAST:  28mL OMNIPAQUE IOHEXOL 350 MG/ML SOLN COMPARISON:  CT 02/15/2021 FINDINGS: Cardiovascular: Post CABG changes. Tortuous thoracic aortic arch with moderate aortic  atherosclerosis.  Mild aneurysmal dilatation of the ascending aorta up to 4 cm. No dissection is seen. Common origin of the left common carotid and right brachiocephalic vessels. Borderline cardiomegaly. No pericardial effusion. Mediastinum/Nodes: Midline trachea. No thyroid mass. Mostly solid anterior mediastinal mass measuring 6.4 by 4.7 by 5.6 cm anterior to the aortic root and ascending aorta. Mass appears to contain small cystic spaces. Esophagus within normal limits. No suspicious lymph nodes. Lungs/Pleura: No acute consolidation, pleural effusion, or pneumothorax. Mild subpleural reticulation consistent with scarring. Punctate 2-3 mm peripheral right upper lobe lung nodules, series 3, image 59 and punctate subpleural right lower lobe lung nodule, series 3, image 97. Upper Abdomen: Cyst within the liver. Exophytic cyst at the anterolateral spleen. Small amount of free fluid in the upper quadrants. Musculoskeletal: Post sternotomy changes. No acute osseous abnormality. IMPRESSION: 1. 6.4 x 4.7 x 5.6 cm indeterminate solid mass within the anterior mediastinum; mass could represent thymoma, thymic carcinoma, or potential lymphoma. Cardiothoracic surgical consultation is recommended. 2. Post CABG changes. Ascending aortic diameter up to 4 cm. Recommend annual imaging followup by CTA or MRA. This recommendation follows 2010 ACCF/AHA/AATS/ACR/ASA/SCA/SCAI/SIR/STS/SVM Guidelines for the Diagnosis and Management of Patients with Thoracic Aortic Disease. Circulation. 2010; 121: L390-Z009. Aortic aneurysm NOS (ICD10-I71.9) 3. Punctate pulmonary nodules measuring up to 3 mm. No follow-up needed if patient is low-risk (and has no known or suspected primary neoplasm). Non-contrast chest CT can be considered in 12 months if patient is high-risk. This recommendation follows the consensus statement: Guidelines for Management of Incidental Pulmonary Nodules Detected on CT Images: From the Fleischner Society 2017; Radiology  2017; 284:228-243. 4. Trace free fluid in the bilateral upper quadrants Aortic aneurysm NOS (ICD10-I71.9).Aortic Atherosclerosis (ICD10-I70.0). Electronically Signed   By: Donavan Foil M.D.   On: 02/17/2021 18:34    ASSESSMENT: Mr. Klug is an 80 year old male who is evaluated for abdominal pain found to have diverticulitis and incidental mediastinal mass.  PLAN: Discussed case with Dr. Grayland Ormond who recommends inpatient CT-guided biopsy of mass.  Patient adamant about going home given uncontrolled pain.  He wishes to go home where he knows he will be comfortable.  Mr. Unangst did state he was interested in having a biopsy completed to find out what the mass is.  We will get this scheduled within 1 to 2 weeks given the holiday and follow-up with Dr. Grayland Ormond in 7 to 10 days after.  I do have some concerns regarding transportation to and from the hospital and/or the cancer center for results.  Patient does not have family nor friends to help and would need to take a taxi.  Patient may need a ride to and from the cancer center but will let us know.  Disposition- He will likely be discharged from the hospital this afternoon. We will get the CT biopsy of the mass scheduled ASAP and follow-up with Dr. Grayland Ormond 7 to 10 days later. Patient is aware of possible delay in treatment due to holiday and him wishing to be discharged from the hospital.  I spent 45 minutes dedicated to the care of this patient (face-to-face and non-face-to-face) on the date of the encounter to include what is described in the assessment and plan.  Patient expressed understanding and was in agreement with this plan. He also understands that He can call clinic at any time with any questions, concerns, or complaints.    Cancer Staging  No matching staging information was found for the patient.  Jacquelin Hawking, NP   02/18/2021 1:03 PM

## 2021-02-18 NOTE — Progress Notes (Signed)
PROGRESS NOTE    Anson Peddie  RWE:[id number] DOB: 1940-03-26 DOA: 02/17/2021 PCP: Teodoro Spray, MD    Brief Narrative:  80 year old gentleman with history of coronary artery disease status post CABG, hypertension, history of upper GI bleeding, anxiety and depression as well panic attacks presented to the ER on 12/12 with left lower quadrant abdominal pain and found to have uncomplicated acute diverticulitis, CT scan also found mediastinal mass.  He was advised to stay back and complete studies, left AMA.  He had a televisit with his psychiatrist who recommended him to come back to the ER.  Dedicated CT chest showed 6 x 5 cm mediastinal mass with no obstruction.  CT scan abdomen pelvis with multiple thickened small bowel loops, inflammatory stranding around the proximal sigmoid colon.   Assessment & Plan:   Principal Problem:   Acute diverticulitis Active Problems:   CAD (coronary artery disease)   Essential hypertension   S/P CABG (coronary artery bypass graft)   RBBB   AAA (abdominal aortic aneurysm)   Anterior mediastinal tumor  Acute diverticulitis and enteritis: First episode.  Uncomplicated.  Treated with IV Unasyn and IV fluids that we will continue.  Tolerating liquids.  Will advance to soft diet today and monitor for tolerance. Never had colonoscopy.  Will need follow-up colonoscopy, currently needing oncology work-up for mediastinal mass.  Anterior mediastinal tumor:  Discussed with oncology.  Patient with poor follow-up.  Will initiate inpatient work-up if patient agrees.  Essential hypertension/coronary artery disease status post CABG/chronic right bundle branch block/4 cm abdominal aortic aneurysm: All stable.  Resume home medication including metoprolol and lisinopril.    Smoker: Counseled to quit.  Nicotine patch.  Anxiety/panic attacks: Good response to benzodiazepines.  Will use dose of Valium during his investigations.  Discussed with his psychiatrist, will  manage with hydroxyzine as needed.  No benzos on discharge.  Called and discussed case with oncology for consultation. Received call from his psychiatrist, discussed about ongoing care and patient with less social support.   DVT prophylaxis: enoxaparin (LOVENOX) injection 40 mg Start: 02/18/21 0800   Code Status: Full code Family Communication: None Disposition Plan: Status is: Inpatient  Remains inpatient appropriate because: Inpatient procedures needed.  Challenging for diet tolerance.         Consultants:  Oncology  Procedures:  None  Antimicrobials:  Unasyn 12/13---   Subjective: Patient seen and examined.  Poor historian overall.  Wants to go home.  Tells me that he does not have any abdominal pain.  He told his psychiatrist yesterday that he was having chest pain and abdominal pain.  He is usually constipated and denies any hematochezia when he had a bowel movement 5 days ago.  Denies any nausea vomiting. I was able to convince him to stay back to be evaluated by oncology and start the diagnostic procedure.  Objective: Vitals:   02/18/21 0345 02/18/21 0349 02/18/21 0759 02/18/21 1130  BP: (!) 172/71 (!) 157/65 (!) 158/57 131/64  Pulse: 67 65 61 62  Resp:   20 20  Temp: 97.6 F (36.4 C) 97.8 F (36.6 C) 98.1 F (36.7 C) 98.8 F (37.1 C)  TempSrc: Oral Oral  Oral  SpO2: 98% 99% 100% 96%  Weight:      Height:        Intake/Output Summary (Last 24 hours) at 02/18/2021 1250 Last data filed at 02/18/2021 0700 Gross per 24 hour  Intake 358.4 ml  Output 3 ml  Net 355.4 ml   Danley Danker  Weights   02/17/21 1722 02/17/21 2253  Weight: 75 kg 75.5 kg    Examination:  General exam: Appears calm and comfortable  Slightly anxious with flat affect. Respiratory system: Clear to auscultation. Respiratory effort normal.  No added sounds. Cardiovascular system: S1 & S2 heard, RRR.  Gastrointestinal system: Nontender.  Bowel sounds present.   Central nervous system:  Alert and oriented. No focal neurological deficits. Extremities: Symmetric 5 x 5 power.    Data Reviewed: I have personally reviewed following labs and imaging studies  CBC: Recent Labs  Lab 02/15/21 2107 02/17/21 1723 02/18/21 0613  WBC 12.2* 8.5 6.6  NEUTROABS  --  7.4  --   HGB 14.7 12.6* 10.5*  HCT 42.3 37.6* 30.6*  MCV 88.3 90.2 88.4  PLT 207 177 185*   Basic Metabolic Panel: Recent Labs  Lab 02/15/21 2107 02/17/21 1723 02/18/21 0613  NA 135 133*  --   K 3.8 4.1  --   CL 104 104  --   CO2 22 23  --   GLUCOSE 132* 95  --   BUN 24* 30*  --   CREATININE 1.18 1.35* 1.16  CALCIUM 9.5 8.6*  --    GFR: Estimated Creatinine Clearance: 53.3 mL/min (by C-G formula based on SCr of 1.16 mg/dL). Liver Function Tests: Recent Labs  Lab 02/15/21 2107  AST 14*  ALT 14  ALKPHOS 69  BILITOT 1.1  PROT 7.2  ALBUMIN 4.0   Recent Labs  Lab 02/15/21 2107  LIPASE 24   No results for input(s): AMMONIA in the last 168 hours. Coagulation Profile: No results for input(s): INR, PROTIME in the last 168 hours. Cardiac Enzymes: No results for input(s): CKTOTAL, CKMB, CKMBINDEX, TROPONINI in the last 168 hours. BNP (last 3 results) No results for input(s): PROBNP in the last 8760 hours. HbA1C: No results for input(s): HGBA1C in the last 72 hours. CBG: Recent Labs  Lab 02/18/21 0350  GLUCAP 84   Lipid Profile: No results for input(s): CHOL, HDL, LDLCALC, TRIG, CHOLHDL, LDLDIRECT in the last 72 hours. Thyroid Function Tests: No results for input(s): TSH, T4TOTAL, FREET4, T3FREE, THYROIDAB in the last 72 hours. Anemia Panel: No results for input(s): VITAMINB12, FOLATE, FERRITIN, TIBC, IRON, RETICCTPCT in the last 72 hours. Sepsis Labs: No results for input(s): PROCALCITON, LATICACIDVEN in the last 168 hours.  Recent Results (from the past 240 hour(s))  Resp Panel by RT-PCR (Flu A&B, Covid) Nasopharyngeal Swab     Status: None   Collection Time: 02/17/21  8:45 PM    Specimen: Nasopharyngeal Swab; Nasopharyngeal(NP) swabs in vial transport medium  Result Value Ref Range Status   SARS Coronavirus 2 by RT PCR NEGATIVE NEGATIVE Final    Comment: (NOTE) SARS-CoV-2 target nucleic acids are NOT DETECTED.  The SARS-CoV-2 RNA is generally detectable in upper respiratory specimens during the acute phase of infection. The lowest concentration of SARS-CoV-2 viral copies this assay can detect is 138 copies/mL. A negative result does not preclude SARS-Cov-2 infection and should not be used as the sole basis for treatment or other patient management decisions. A negative result may occur with  improper specimen collection/handling, submission of specimen other than nasopharyngeal swab, presence of viral mutation(s) within the areas targeted by this assay, and inadequate number of viral copies(<138 copies/mL). A negative result must be combined with clinical observations, patient history, and epidemiological information. The expected result is Negative.  Fact Sheet for Patients:  EntrepreneurPulse.com.au  Fact Sheet for Healthcare Providers:  IncredibleEmployment.be  This  test is no t yet approved or cleared by the [place] and  has been authorized for detection and/or diagnosis of SARS-CoV-2 by FDA under an Emergency Use Authorization (EUA). This EUA will remain  in effect (meaning this test can be used) for the duration of the COVID-19 declaration under Section 564(b)(1) of the Act, 21 U.S.C.section 360bbb-3(b)(1), unless the authorization is terminated  or revoked sooner.       Influenza A by PCR NEGATIVE NEGATIVE Final   Influenza B by PCR NEGATIVE NEGATIVE Final    Comment: (NOTE) The Xpert Xpress SARS-CoV-2/FLU/RSV plus assay is intended as an aid in the diagnosis of influenza from Nasopharyngeal swab specimens and should not be used as a sole basis for treatment. Nasal washings and aspirates are  unacceptable for Xpert Xpress SARS-CoV-2/FLU/RSV testing.  Fact Sheet for Patients: EntrepreneurPulse.com.au  Fact Sheet for Healthcare Providers: IncredibleEmployment.be  This test is not yet approved or cleared by the [place] FDA and has been authorized for detection and/or diagnosis of SARS-CoV-2 by FDA under an Emergency Use Authorization (EUA). This EUA will remain in effect (meaning this test can be used) for the duration of the COVID-19 declaration under Section 564(b)(1) of the Act, 21 U.S.C. section 360bbb-3(b)(1), unless the authorization is terminated or revoked.  Performed at [facility], [address] [place], [place] [address]          Radiology Studies: CT Chest W Contrast  Result Date: 02/17/2021 CLINICAL DATA:  Central chest mass EXAM: CT CHEST WITH CONTRAST TECHNIQUE: Multidetector CT imaging of the chest was performed during intravenous contrast administration. CONTRAST:  90mL OMNIPAQUE IOHEXOL 350 MG/ML SOLN COMPARISON:  CT 02/15/2021 FINDINGS: Cardiovascular: Post CABG changes. Tortuous thoracic aortic arch with moderate aortic atherosclerosis. Mild aneurysmal dilatation of the ascending aorta up to 4 cm. No dissection is seen. Common origin of the left common carotid and right brachiocephalic vessels. Borderline cardiomegaly. No pericardial effusion. Mediastinum/Nodes: Midline trachea. No thyroid mass. Mostly solid anterior mediastinal mass measuring 6.4 by 4.7 by 5.6 cm anterior to the aortic root and ascending aorta. Mass appears to contain small cystic spaces. Esophagus within normal limits. No suspicious lymph nodes. Lungs/Pleura: No acute consolidation, pleural effusion, or pneumothorax. Mild subpleural reticulation consistent with scarring. Punctate 2-3 mm peripheral right upper lobe lung nodules, series 3, image 59 and punctate subpleural right lower lobe lung nodule, series 3, image 97. Upper Abdomen:  Cyst within the liver. Exophytic cyst at the anterolateral spleen. Small amount of free fluid in the upper quadrants. Musculoskeletal: Post sternotomy changes. No acute osseous abnormality. IMPRESSION: 1. 6.4 x 4.7 x 5.6 cm indeterminate solid mass within the anterior mediastinum; mass could represent thymoma, thymic carcinoma, or potential lymphoma. Cardiothoracic surgical consultation is recommended. 2. Post CABG changes. Ascending aortic diameter up to 4 cm. Recommend annual imaging followup by CTA or MRA. This recommendation follows 2010 ACCF/AHA/AATS/ACR/ASA/SCA/SCAI/SIR/STS/SVM Guidelines for the Diagnosis and Management of Patients with Thoracic Aortic Disease. Circulation. 2010; 121: O115-B262. Aortic aneurysm NOS (ICD10-I71.9) 3. Punctate pulmonary nodules measuring up to 3 mm. No follow-up needed if patient is low-risk (and has no known or suspected primary neoplasm). Non-contrast chest CT can be considered in 12 months if patient is high-risk. This recommendation follows the consensus statement: Guidelines for Management of Incidental Pulmonary Nodules Detected on CT Images: From the Fleischner Society 2017; Radiology 2017; 284:228-243. 4. Trace free fluid in the bilateral upper quadrants Aortic aneurysm NOS (ICD10-I71.9).Aortic Atherosclerosis (ICD10-I70.0). Electronically Signed   By: Maudie Mercury  Francoise Ceo M.D.   On: 02/17/2021 18:34        Scheduled Meds:  atorvastatin  80 mg Oral QHS   busPIRone  20 mg Oral BID   enoxaparin (LOVENOX) injection  40 mg Subcutaneous Q24H   hydrOXYzine  25 mg Oral Daily   [START ON 02/19/2021] influenza vaccine adjuvanted  0.5 mL Intramuscular Tomorrow-1000   metoprolol tartrate  25 mg Oral QHS   mirtazapine  45 mg Oral QHS   nicotine  21 mg Transdermal Once   polyethylene glycol  17 g Oral Daily   Continuous Infusions:  sodium chloride 100 mL/hr at 02/18/21 0413   ampicillin-sulbactam (UNASYN) IV 3 g (02/18/21 1016)     LOS: 1 day    Time spent: 30  minutes    Barb Merino, MD [facility] Pager [phone]

## 2021-02-20 NOTE — Telephone Encounter (Signed)
Message received from scheduling that radiologist recommends pt should get a PET CT and be evaluated by the cardiothoracic surgery service at [facility] prior to proceeding with CT guided Bx.

## 2021-08-12 ENCOUNTER — Ambulatory Visit: Payer: Self-pay | Admitting: Surgery

## 2021-08-12 MED ORDER — CEFAZOLIN SODIUM-DEXTROSE 2-4 GM/100ML-% IV SOLN
2.0000 g | INTRAVENOUS | Status: AC
  Administered 2021-08-13: 2 g via INTRAVENOUS

## 2021-08-12 MED ORDER — CHLORHEXIDINE GLUCONATE CLOTH 2 % EX PADS: 6.0000 | MEDICATED_PAD | Freq: Once | CUTANEOUS | Status: DC

## 2021-08-12 NOTE — H&P (Signed)
Subjective:  CC: Gluteal abscess [L02.31]  HPI:  [name] [name] is a 81 y.o. male who was referred by Cyndie Chime, MD for evaluation of above. First noted several years ago.  Episodes of drainage in past.  Most recent episode started few weeks ago. Symptoms include: Pain is sharp, localized.  Exacerbated by pressure.  Alleviated by pain meds for arthritis.  Associated with bleeding and purulent drainage.     Past Medical History:  has a past medical history of Anxiety, Depression, HLD (hyperlipidemia), HTN (hypertension), NSTEMI (non-ST elevation myocardial infarction) (CMS-HCC), Upper GI bleed, and Vitamin D deficiency.  Past Surgical History:  has a past surgical history that includes Tonsillectomy; Anal cyst removal; egd (N/A, 09/28/2012); coronary artery bypass w/single artery graft (N/A, 10/02/2012); coronary artery bypass w/venous & arterial grafts (N/A, 10/02/2012); transesophageal echocardiography (N/A, 10/02/2012); Coronary artery bypass graft; and Excision of soft tissue mass dorsal aspect of right hand (Right, 02/19/2015).  Family History: family history includes Alcohol abuse in his father; Throat cancer in his father.  Social History:  reports that he has been smoking cigarettes. He has a 14.00 pack-year smoking history. He has never used smokeless tobacco. He reports that he does not drink alcohol and does not use drugs.  Current Medications: has a current medication list which includes the following prescription(s): amlodipine, atorvastatin, baclofen, buspirone, doxycycline, hydroxyzine, lisinopril, metoprolol tartrate, mirtazapine, ondansetron, oxycodone, trazodone, naloxone, oxycodone, and [START ON 08/22/2021] oxycodone.  Allergies:  Allergies Allergen Reactions  Adhesive Other (See Comments)   bandaids cause petechiae when removed  Cymbalta [Duloxetine] Other (See Comments)   Urinary retention   ROS:  A 15 point review of systems was performed and pertinent positives and  negatives noted in HPI   Objective:    BP 126/74   Pulse 63   Ht 182.9 cm (6') Comment: Per chart  Wt 75.3 kg (166 lb) Comment: Per chart  BMI 22.51 kg/m   Constitutional :  No distress, cooperative, alert Lymphatics/Throat:  Supple with no lymphadenopathy Respiratory:  Clear to auscultation bilaterally Cardiovascular:  Regular rate and rhythm Gastrointestinal: Soft, non-tender, non-distended, no organomegaly. Musculoskeletal: Steady gait and movement Skin: Cool and moist, left gluteus toward lateral edge of sacrum, obvious gluteal abscess with scant purulent drainage through open wound at apex.  Of note, chronic midline pits also noted, but no TTP around midline. Psychiatric: Normal affect, non-agitated, not confused       LABS:  N/a   RADS: N/a  Assessment:     Gluteal abscess [L02.31], unlikely to be pilonidal, likely epidermal cyst but will perform I&D in OR just in case.  Plan:    1. Gluteal abscess [L02.31] Discussed surgical excision.  Alternatives include continued observation.  Benefits include possible symptom relief, pathologic evaluation, improved cosmesis. Discussed the risk of surgery including recurrence, chronic pain, post-op infxn, poor cosmesis, poor/delayed wound healing, and possible re-operation to address said risks. The risks of general anesthetic, if used, includes MI, CVA, sudden death or even reaction to anesthetic medications also discussed.  Typical post-op recovery time of 3-5 days with possible activity restrictions were also discussed.  The patient verbalized understanding and all questions were answered to the patient's satisfaction.  2. Patient has elected to proceed with surgical treatment. Procedure will be scheduled.  labs/images/medications/previous chart entries reviewed personally and relevant changes/updates noted above.  PRONE position.

## 2021-08-12 NOTE — H&P (View-Only) (Signed)
Subjective:  CC: Gluteal abscess [L02.31]  HPI:  [name] [name] is a 81 y.o. male who was referred by Cyndie Chime, MD for evaluation of above. First noted several years ago.  Episodes of drainage in past.  Most recent episode started few weeks ago. Symptoms include: Pain is sharp, localized.  Exacerbated by pressure.  Alleviated by pain meds for arthritis.  Associated with bleeding and purulent drainage.     Past Medical History:  has a past medical history of Anxiety, Depression, HLD (hyperlipidemia), HTN (hypertension), NSTEMI (non-ST elevation myocardial infarction) (CMS-HCC), Upper GI bleed, and Vitamin D deficiency.  Past Surgical History:  has a past surgical history that includes Tonsillectomy; Anal cyst removal; egd (N/A, 09/28/2012); coronary artery bypass w/single artery graft (N/A, 10/02/2012); coronary artery bypass w/venous & arterial grafts (N/A, 10/02/2012); transesophageal echocardiography (N/A, 10/02/2012); Coronary artery bypass graft; and Excision of soft tissue mass dorsal aspect of right hand (Right, 02/19/2015).  Family History: family history includes Alcohol abuse in his father; Throat cancer in his father.  Social History:  reports that he has been smoking cigarettes. He has a 14.00 pack-year smoking history. He has never used smokeless tobacco. He reports that he does not drink alcohol and does not use drugs.  Current Medications: has a current medication list which includes the following prescription(s): amlodipine, atorvastatin, baclofen, buspirone, doxycycline, hydroxyzine, lisinopril, metoprolol tartrate, mirtazapine, ondansetron, oxycodone, trazodone, naloxone, oxycodone, and [START ON 08/22/2021] oxycodone.  Allergies:  Allergies Allergen Reactions  Adhesive Other (See Comments)   bandaids cause petechiae when removed  Cymbalta [Duloxetine] Other (See Comments)   Urinary retention   ROS:  A 15 point review of systems was performed and pertinent positives and  negatives noted in HPI   Objective:    BP 126/74   Pulse 63   Ht 182.9 cm (6') Comment: Per chart  Wt 75.3 kg (166 lb) Comment: Per chart  BMI 22.51 kg/m   Constitutional :  No distress, cooperative, alert Lymphatics/Throat:  Supple with no lymphadenopathy Respiratory:  Clear to auscultation bilaterally Cardiovascular:  Regular rate and rhythm Gastrointestinal: Soft, non-tender, non-distended, no organomegaly. Musculoskeletal: Steady gait and movement Skin: Cool and moist, left gluteus toward lateral edge of sacrum, obvious gluteal abscess with scant purulent drainage through open wound at apex.  Of note, chronic midline pits also noted, but no TTP around midline. Psychiatric: Normal affect, non-agitated, not confused       LABS:  N/a   RADS: N/a  Assessment:     Gluteal abscess [L02.31], unlikely to be pilonidal, likely epidermal cyst but will perform I&D in OR just in case.  Plan:    1. Gluteal abscess [L02.31] Discussed surgical excision.  Alternatives include continued observation.  Benefits include possible symptom relief, pathologic evaluation, improved cosmesis. Discussed the risk of surgery including recurrence, chronic pain, post-op infxn, poor cosmesis, poor/delayed wound healing, and possible re-operation to address said risks. The risks of general anesthetic, if used, includes MI, CVA, sudden death or even reaction to anesthetic medications also discussed.  Typical post-op recovery time of 3-5 days with possible activity restrictions were also discussed.  The patient verbalized understanding and all questions were answered to the patient's satisfaction.  2. Patient has elected to proceed with surgical treatment. Procedure will be scheduled.  labs/images/medications/previous chart entries reviewed personally and relevant changes/updates noted above.  PRONE position.

## 2021-08-13 ENCOUNTER — Encounter: Payer: Self-pay | Admitting: Surgery

## 2021-08-13 ENCOUNTER — Ambulatory Visit: Payer: Medicare Other | Admitting: Certified Registered"

## 2021-08-13 ENCOUNTER — Encounter
Admission: RE | Disposition: A | Discharge status: Home / Self Care | Payer: Self-pay | Source: Home / Self Care | Attending: Surgery

## 2021-08-13 ENCOUNTER — Ambulatory Visit
Admission: RE | Admit: 2021-08-13 | Discharge: 2021-08-13 | Disposition: A | Discharge status: Home / Self Care | Payer: Medicare Other | Attending: Surgery | Admitting: Surgery

## 2021-08-13 ENCOUNTER — Other Ambulatory Visit: Payer: Self-pay

## 2021-08-13 DIAGNOSIS — L720 Epidermal cyst: Secondary | ICD-10-CM | POA: Diagnosis not present

## 2021-08-13 DIAGNOSIS — L0231 Cutaneous abscess of buttock: Secondary | ICD-10-CM | POA: Insufficient documentation

## 2021-08-13 DIAGNOSIS — I4510 Unspecified right bundle-branch block: Secondary | ICD-10-CM

## 2021-08-13 DIAGNOSIS — Z951 Presence of aortocoronary bypass graft: Secondary | ICD-10-CM

## 2021-08-13 DIAGNOSIS — R42 Dizziness and giddiness: Secondary | ICD-10-CM

## 2021-08-13 DIAGNOSIS — K5792 Diverticulitis of intestine, part unspecified, without perforation or abscess without bleeding: Secondary | ICD-10-CM

## 2021-08-13 DIAGNOSIS — L723 Sebaceous cyst: Secondary | ICD-10-CM | POA: Insufficient documentation

## 2021-08-13 DIAGNOSIS — I10 Essential (primary) hypertension: Secondary | ICD-10-CM

## 2021-08-13 DIAGNOSIS — R040 Epistaxis: Secondary | ICD-10-CM

## 2021-08-13 DIAGNOSIS — D4989 Neoplasm of unspecified behavior of other specified sites: Secondary | ICD-10-CM

## 2021-08-13 HISTORY — PX: INCISION AND DRAINAGE ABSCESS: SHX5864

## 2021-08-13 SURGERY — INCISION AND DRAINAGE, ABSCESS
Anesthesia: General | Site: Buttocks

## 2021-08-13 MED ORDER — ACETAMINOPHEN 325 MG PO TABS: 650.0000 mg | ORAL_TABLET | Freq: Three times a day (TID) | ORAL | 0 refills | Status: AC | PRN

## 2021-08-13 MED ORDER — MIDAZOLAM HCL 2 MG/2ML IJ SOLN
INTRAMUSCULAR | Status: AC
  Filled 2021-08-13: qty 2

## 2021-08-13 MED ORDER — FENTANYL CITRATE (PF) 100 MCG/2ML IJ SOLN
INTRAMUSCULAR | Status: AC
  Filled 2021-08-13: qty 2

## 2021-08-13 MED ORDER — LIDOCAINE HCL (PF) 2 % IJ SOLN
INTRAMUSCULAR | Status: AC
  Filled 2021-08-13: qty 5

## 2021-08-13 MED ORDER — DOCUSATE SODIUM 100 MG PO CAPS: 100.0000 mg | ORAL_CAPSULE | Freq: Two times a day (BID) | ORAL | 0 refills | Status: AC | PRN

## 2021-08-13 MED ORDER — LIDOCAINE HCL (PF) 1 % IJ SOLN
INTRAMUSCULAR | Status: AC
  Filled 2021-08-13: qty 30

## 2021-08-13 MED ORDER — CHLORHEXIDINE GLUCONATE 0.12 % MT SOLN: 15.0000 mL | Freq: Once | OROMUCOSAL | Status: AC

## 2021-08-13 MED ORDER — PROPOFOL 10 MG/ML IV BOLUS
INTRAVENOUS | Status: AC
  Filled 2021-08-13: qty 20

## 2021-08-13 MED ORDER — BUPIVACAINE-EPINEPHRINE (PF) 0.25% -1:200000 IJ SOLN
INTRAMUSCULAR | Status: AC
  Filled 2021-08-13: qty 30

## 2021-08-13 MED ORDER — ONDANSETRON HCL 4 MG/2ML IJ SOLN
INTRAMUSCULAR | Status: DC | PRN
  Administered 2021-08-13: 4 mg via INTRAVENOUS

## 2021-08-13 MED ORDER — DEXAMETHASONE SODIUM PHOSPHATE 10 MG/ML IJ SOLN
INTRAMUSCULAR | Status: DC | PRN
  Administered 2021-08-13: 5 mg via INTRAVENOUS

## 2021-08-13 MED ORDER — FENTANYL CITRATE (PF) 100 MCG/2ML IJ SOLN
INTRAMUSCULAR | Status: DC | PRN
Start: 2021-08-13 — End: 2021-08-13
  Administered 2021-08-13 (×2): 25 ug via INTRAVENOUS

## 2021-08-13 MED ORDER — CEFAZOLIN SODIUM-DEXTROSE 2-4 GM/100ML-% IV SOLN
INTRAVENOUS | Status: AC
  Filled 2021-08-13: qty 100

## 2021-08-13 MED ORDER — MIDAZOLAM HCL 2 MG/2ML IJ SOLN
INTRAMUSCULAR | Status: DC | PRN
  Administered 2021-08-13: 1 mg via INTRAVENOUS

## 2021-08-13 MED ORDER — LIDOCAINE HCL 1 % IJ SOLN
INTRAMUSCULAR | Status: DC | PRN
  Administered 2021-08-13: 18 mL via RESPIRATORY_TRACT

## 2021-08-13 MED ORDER — ONDANSETRON HCL 4 MG/2ML IJ SOLN
INTRAMUSCULAR | Status: AC
  Filled 2021-08-13: qty 2

## 2021-08-13 MED ORDER — 0.9 % SODIUM CHLORIDE (POUR BTL) OPTIME
TOPICAL | Status: DC | PRN
  Administered 2021-08-13: 500 mL

## 2021-08-13 MED ORDER — METOPROLOL TARTRATE 25 MG PO TABS
ORAL_TABLET | ORAL | Status: AC
  Filled 2021-08-13: qty 1

## 2021-08-13 MED ORDER — ORAL CARE MOUTH RINSE
15.0000 mL | Freq: Once | OROMUCOSAL | Status: AC
Start: 2021-08-13 — End: 2021-08-13

## 2021-08-13 MED ORDER — LACTATED RINGERS IV SOLN
INTRAVENOUS | Status: DC
Start: 2021-08-13 — End: 2021-08-13

## 2021-08-13 MED ORDER — LIDOCAINE HCL (CARDIAC) PF 100 MG/5ML IV SOSY
PREFILLED_SYRINGE | INTRAVENOUS | Status: DC | PRN
  Administered 2021-08-13: 50 mg via INTRAVENOUS

## 2021-08-13 MED ORDER — CHLORHEXIDINE GLUCONATE 0.12 % MT SOLN
OROMUCOSAL | Status: AC
  Administered 2021-08-13: 15 mL via OROMUCOSAL
  Filled 2021-08-13: qty 15

## 2021-08-13 MED ORDER — PROPOFOL 10 MG/ML IV BOLUS
INTRAVENOUS | Status: DC | PRN
  Administered 2021-08-13: 50 mg via INTRAVENOUS
  Administered 2021-08-13: 100 ug/kg/min via INTRAVENOUS

## 2021-08-13 MED ORDER — SUCCINYLCHOLINE CHLORIDE 200 MG/10ML IV SOSY
PREFILLED_SYRINGE | INTRAVENOUS | Status: AC
  Filled 2021-08-13: qty 10

## 2021-08-13 MED ORDER — DEXAMETHASONE SODIUM PHOSPHATE 10 MG/ML IJ SOLN
INTRAMUSCULAR | Status: AC
Start: 2021-08-13 — End: ?
  Filled 2021-08-13: qty 1

## 2021-08-13 SURGICAL SUPPLY — 36 items
APL PRP STRL LF DISP 70% ISPRP (MISCELLANEOUS) ×1
BLADE SURG 15 STRL LF DISP TIS (BLADE) ×1 IMPLANT
BLADE SURG 15 STRL SS (BLADE) ×2
BULB RESERV EVAC DRAIN JP 100C (MISCELLANEOUS) ×1 IMPLANT
CHLORAPREP W/TINT 26 (MISCELLANEOUS) ×2 IMPLANT
DRAIN JP 10F RND SILICONE (MISCELLANEOUS) ×1 IMPLANT
DRAPE LAPAROTOMY 100X77 ABD (DRAPES) ×2 IMPLANT
DRSG OPSITE POSTOP 4X6 (GAUZE/BANDAGES/DRESSINGS) ×1 IMPLANT
ELECT REM PT RETURN 9FT ADLT (ELECTROSURGICAL) ×2
ELECTRODE REM PT RTRN 9FT ADLT (ELECTROSURGICAL) ×1 IMPLANT
GAUZE 4X4 16PLY ~~LOC~~+RFID DBL (SPONGE) ×2 IMPLANT
GAUZE PACKING IODOFORM 1/2 (PACKING) IMPLANT
GAUZE SPONGE 4X4 12PLY STRL (GAUZE/BANDAGES/DRESSINGS) ×2 IMPLANT
GLOVE BIOGEL PI IND STRL 7.0 (GLOVE) ×1 IMPLANT
GLOVE BIOGEL PI INDICATOR 7.0 (GLOVE) ×1
GLOVE SURG SYN 6.5 ES PF (GLOVE) ×4 IMPLANT
GLOVE SURG SYN 6.5 PF PI (GLOVE) ×2 IMPLANT
GOWN STRL REUS W/ TWL LRG LVL3 (GOWN DISPOSABLE) ×2 IMPLANT
GOWN STRL REUS W/TWL LRG LVL3 (GOWN DISPOSABLE) ×4
LABEL OR SOLS (LABEL) ×2 IMPLANT
MANIFOLD NEPTUNE II (INSTRUMENTS) ×2 IMPLANT
NEEDLE HYPO 22GX1.5 SAFETY (NEEDLE) ×2 IMPLANT
NS IRRIG 500ML POUR BTL (IV SOLUTION) ×2 IMPLANT
PACK BASIN MINOR ARMC (MISCELLANEOUS) ×2 IMPLANT
SPONGE VERSALON 4X4 4PLY (MISCELLANEOUS) ×1 IMPLANT
STAPLER SKIN PROX 35W (STAPLE) ×1 IMPLANT
SUT ETHILON 3-0 (SUTURE) ×2 IMPLANT
SUT MNCRL 4-0 (SUTURE)
SUT MNCRL 4-0 27XMFL (SUTURE)
SUT VIC AB 2-0 CT2 27 (SUTURE) ×1 IMPLANT
SUT VIC AB 3-0 SH 27 (SUTURE) ×2
SUT VIC AB 3-0 SH 27X BRD (SUTURE) ×1 IMPLANT
SUTURE MNCRL 4-0 27XMF (SUTURE) ×1 IMPLANT
SYR 10ML LL (SYRINGE) ×2 IMPLANT
TOWEL OR 17X26 4PK STRL BLUE (TOWEL DISPOSABLE) ×2 IMPLANT
WATER STERILE IRR 500ML POUR (IV SOLUTION) ×2 IMPLANT

## 2021-08-13 NOTE — Transfer of Care (Signed)
Immediate Anesthesia Transfer of Care Note  Patient: Maxey Ransom  Procedure(s) Performed: INCISION AND DRAINAGE ABSCESS (Buttocks)  Patient Location: PACU  Anesthesia Type:General  Level of Consciousness: awake  Airway & Oxygen Therapy: Patient Spontanous Breathing  Post-op Assessment: Report given to RN and Post -op Vital signs reviewed and stable  Post vital signs: Reviewed and stable  Last Vitals:  Vitals Value Taken Time  BP 128/73 1324  Temp 35.9 1324  Pulse 66 08/13/21 1324  Resp 23 08/13/21 1326  SpO2 98 % 08/13/21 1324  Vitals shown include unvalidated device data.  Last Pain:  Vitals:   08/13/21 1025  TempSrc: Temporal  PainSc: 4       Patients Stated Pain Goal: 2 (15/95/39 [id number])  Complications: No notable events documented.

## 2021-08-13 NOTE — Anesthesia Preprocedure Evaluation (Signed)
Anesthesia Evaluation  Patient identified by MRN, date of birth, ID band Patient awake    Reviewed: Allergy & Precautions, H&P , NPO status , Patient's Chart, lab work & pertinent test results, reviewed documented beta blocker date and time   Airway Mallampati: II   Neck ROM: full    Dental  (+) Poor Dentition   Pulmonary neg pulmonary ROS, Current Smoker,    Pulmonary exam normal        Cardiovascular Exercise Tolerance: Good hypertension, On Medications + CAD and + Past MI  Normal cardiovascular exam+ dysrhythmias  Rhythm:regular Rate:Normal  Cleared per cardiology. ja   Neuro/Psych PSYCHIATRIC DISORDERS Anxiety Depression  Neuromuscular disease    GI/Hepatic negative GI ROS, Neg liver ROS,   Endo/Other  negative endocrine ROS  Renal/GU negative Renal ROS  negative genitourinary   Musculoskeletal   Abdominal   Peds  Hematology negative hematology ROS (+)   Anesthesia Other Findings Past Medical History: No date: Anxiety No date: Arthritis     Comment:  right hand, lower back No date: Coronary artery disease No date: Degenerative disc disease, lumbar No date: Depression No date: Hyperlipidemia No date: Hypertension No date: Lumbar paraspinal muscle spasm No date: Lumbar spondylosis 2014: Myocardial infarction [facility])     Comment:  NSTEMI No date: Phlebitis of leg, left     Comment:  s/p vein harvest for by-pass graft No date: Radiculitis, lumbosacral No date: Vitamin D deficiency Past Surgical History: 2014: CORONARY ARTERY BYPASS GRAFT     Comment:   4 vessel No date: CYST EXCISION     Comment:  anal cyst No date: ESOPHAGOGASTRODUODENOSCOPY 02/19/2015: MASS EXCISION; Right     Comment:  Procedure: EXCISION MASS OF SOFT TISSUE RIGHT HAND;                Surgeon: Corky Mull, MD;  Location: [place];  Service: Orthopedics;  Laterality: Right;  LOCAL               PER DR  POGGI No date: TONSILLECTOMY     Comment:  age 81 BMI    Body Mass Index: 22.51 kg/m     Reproductive/Obstetrics negative OB ROS                             Anesthesia Physical Anesthesia Plan  ASA: 4  Anesthesia Plan: General   Post-op Pain Management:    Induction:   PONV Risk Score and Plan: 2  Airway Management Planned:   Additional Equipment:   Intra-op Plan:   Post-operative Plan:   Informed Consent: I have reviewed the patients History and Physical, chart, labs and discussed the procedure including the risks, benefits and alternatives for the proposed anesthesia with the patient or authorized representative who has indicated his/her understanding and acceptance.     Dental Advisory Given  Plan Discussed with: CRNA  Anesthesia Plan Comments:         Anesthesia Quick Evaluation

## 2021-08-13 NOTE — Discharge Instructions (Addendum)
Removal, Care After This sheet gives you information about how to care for yourself after your procedure. Your health care provider may also give you more specific instructions. If you have problems or questions, contact your health care provider. What can I expect after the procedure? After the procedure, it is common to have: Soreness. Bruising. Itching. Follow these instructions at home: site care Follow instructions from your health care provider about how to take care of your site. Make sure you: Wash your hands with soap and water before and after you change your bandage (dressing). If soap and water are not available, use hand sanitizer. Leave stitches (sutures), skin glue, or adhesive strips in place. These skin closures may need to stay in place for 2 weeks or longer. If adhesive strip edges start to loosen and curl up, you may trim the loose edges. Do not remove adhesive strips completely unless your health care provider tells you to do that. If the area bleeds or bruises, apply gentle pressure for 10 minutes. OK TO SHOWER IN 24HRS.  Keep dressing intact and avoid area getting wet. Measure drain output every day as instructed by nursing  Check your site every day for signs of infection. Check for: Redness, swelling, or pain. Fluid or blood outside/around the drain Warmth. Pus or a bad smell.  General instructions Rest and then return to your normal activities as told by your health care provider.  tylenol  as needed for discomfort.     Use narcotics, if prescribed, only when tylenol  is not enough to control pain.  325-'650mg'$  every 8hrs to max of '3000mg'$ /24hrs (including the '325mg'$  in every norco dose) for the tylenol.    Keep all follow-up visits as told by your health care provider. This is important. Contact a health care provider if: You have redness, swelling, or pain around your site. You have fluid or blood coming from your site. Your site feels warm to the touch. You  have pus or a bad smell coming from your site. You have a fever. Your sutures, skin glue, or adhesive strips loosen or come off sooner than expected. Get help right away if: You have bleeding that does not stop with pressure or a dressing. Summary After the procedure, it is common to have some soreness, bruising, and itching at the site. Follow instructions from your health care provider about how to take care of your site. Check your site every day for signs of infection. Contact a health care provider if you have redness, swelling, or pain around your site, or your site feels warm to the touch. Keep all follow-up visits as told by your health care provider. This is important. This information is not intended to replace advice given to you by your health care provider. Make sure you discuss any questions you have with your health care provider. Document Released: 03/21/2015 Document Revised: 08/22/2017 Document Reviewed: 08/22/2017 Elsevier Interactive Patient Education  2019 [place]   The drugs that you were given will stay in your system until tomorrow so for the next 24 hours you should not:  Drive an automobile Make any legal decisions Drink any alcoholic beverage   You may resume regular meals tomorrow.  Today it is better to start with liquids and gradually work up to solid foods.  You may eat anything you prefer, but it is better to start with liquids, then soup and crackers, and gradually work up to solid foods.  Please notify your doctor immediately if you have any unusual bleeding, trouble breathing, redness and pain at the surgery site, drainage, fever, or pain not relieved by medication.    Additional Instructions:        Please contact your physician with any problems or Same Day Surgery at [phone], Monday through Friday 6 am to 4 pm, or [place] at [facility] number at [phone].

## 2021-08-13 NOTE — Op Note (Signed)
Pre-Op Dx: Gluteal abscess Post-Op Dx: Infected sebaceous cyst Anesthesia: MAC EBL: 10 ML Complications:  none apparent Specimen: Gluteal cyst  Procedure: excisional biopsy of infected gluteal cyst Surgeon: Lysle Pearl  Indications for procedure: See H&P  Description of Procedure:  Consent obtained, time out performed.  Patient placed in left lateral position.  Area sterilized and draped in usual position.  Local infused to area previously marked.  8 and cm incision made through dermis, Around the obviously infected and draining area with 10blade and infected epidermal cyst noted in subcutaneous layer.  The  8cm x 9.7cm x 10cm cyst then removed from surrounding tissue completely using electrocautery, passed off field pending pathology.  Wound hemostasis noted, then closed in two layer fashion with 3-0 vicryl in interrupted fashion for deep dermal layer, then staples for skin.  10 French drain placed in the large cavity prior to skin closure to minimize seroma formation, secured to skin using 3-0 nylon.  Wound then dressed with honeycomb dressing.  Pt tolerated procedure well, and transferred to PACU in stable condition. Sponge and instrument count correct at end of procedure.

## 2021-08-13 NOTE — Interval H&P Note (Signed)
No change. OK to proceed.

## 2021-08-14 LAB — SURGICAL PATHOLOGY

## 2021-08-16 ENCOUNTER — Encounter: Payer: Self-pay | Admitting: Emergency Medicine

## 2021-08-16 ENCOUNTER — Other Ambulatory Visit: Payer: Self-pay

## 2021-08-16 ENCOUNTER — Emergency Department
Admission: EM | Admit: 2021-08-16 | Discharge: 2021-08-16 | Disposition: A | Discharge status: Home / Self Care | Payer: Medicare Other | Attending: Emergency Medicine | Admitting: Emergency Medicine

## 2021-08-16 ENCOUNTER — Emergency Department: Payer: Medicare Other

## 2021-08-16 DIAGNOSIS — N400 Enlarged prostate without lower urinary tract symptoms: Secondary | ICD-10-CM | POA: Diagnosis not present

## 2021-08-16 DIAGNOSIS — R109 Unspecified abdominal pain: Secondary | ICD-10-CM | POA: Diagnosis present

## 2021-08-16 DIAGNOSIS — D72829 Elevated white blood cell count, unspecified: Secondary | ICD-10-CM | POA: Insufficient documentation

## 2021-08-16 DIAGNOSIS — R197 Diarrhea, unspecified: Secondary | ICD-10-CM | POA: Insufficient documentation

## 2021-08-16 DIAGNOSIS — R110 Nausea: Secondary | ICD-10-CM | POA: Diagnosis not present

## 2021-08-16 DIAGNOSIS — I7143 Infrarenal abdominal aortic aneurysm, without rupture: Secondary | ICD-10-CM | POA: Insufficient documentation

## 2021-08-16 DIAGNOSIS — K529 Noninfective gastroenteritis and colitis, unspecified: Secondary | ICD-10-CM

## 2021-08-16 DIAGNOSIS — Z20822 Contact with and (suspected) exposure to covid-19: Secondary | ICD-10-CM | POA: Insufficient documentation

## 2021-08-16 LAB — COMPREHENSIVE METABOLIC PANEL
ALT: 18 U/L (ref 0–44)
AST: 17 U/L (ref 15–41)
Albumin: 4.2 g/dL (ref 3.5–5.0)
Alkaline Phosphatase: 73 U/L (ref 38–126)
Anion gap: 8 (ref 5–15)
BUN: 40 mg/dL — ABNORMAL HIGH (ref 8–23)
CO2: 26 mmol/L (ref 22–32)
Calcium: 10.9 mg/dL — ABNORMAL HIGH (ref 8.9–10.3)
Chloride: 103 mmol/L (ref 98–111)
Creatinine, Ser: 1.42 mg/dL — ABNORMAL HIGH (ref 0.61–1.24)
GFR, Estimated: 50 mL/min — ABNORMAL LOW (ref >60)
Glucose, Bld: 134 mg/dL — ABNORMAL HIGH (ref 70–99)
Potassium: 4.1 mmol/L (ref 3.5–5.1)
Sodium: 137 mmol/L (ref 135–145)
Total Bilirubin: 0.4 mg/dL (ref 0.3–1.2)
Total Protein: 7.3 g/dL (ref 6.5–8.1)

## 2021-08-16 LAB — URINALYSIS, ROUTINE W REFLEX MICROSCOPIC
Bilirubin Urine: NEGATIVE
Glucose, UA: NEGATIVE mg/dL
Hgb urine dipstick: NEGATIVE
Ketones, ur: NEGATIVE mg/dL
Nitrite: NEGATIVE
Protein, ur: NEGATIVE mg/dL
Specific Gravity, Urine: 1.023 (ref 1.005–1.030)
pH: 5 (ref 5.0–8.0)

## 2021-08-16 LAB — TROPONIN I (HIGH SENSITIVITY): Troponin I (High Sensitivity): 17 ng/L (ref <18)

## 2021-08-16 LAB — GASTROINTESTINAL PANEL BY PCR, STOOL (REPLACES STOOL CULTURE)

## 2021-08-16 LAB — CBC
HCT: 44 % (ref 39.0–52.0)
Hemoglobin: 14.9 g/dL (ref 13.0–17.0)
MCH: 29.5 pg (ref 26.0–34.0)
MCHC: 33.9 g/dL (ref 30.0–36.0)
MCV: 87.1 fL (ref 80.0–100.0)
Platelets: 306 10*3/uL (ref 150–400)
RBC: 5.05 MIL/uL (ref 4.22–5.81)
RDW: 14.6 % (ref 11.5–15.5)
WBC: 12.9 10*3/uL — ABNORMAL HIGH (ref 4.0–10.5)
nRBC: 0 % (ref 0.0–0.2)

## 2021-08-16 LAB — AEROBIC/ANAEROBIC CULTURE W GRAM STAIN (SURGICAL/DEEP WOUND): Culture: NEGATIVE

## 2021-08-16 LAB — C DIFFICILE QUICK SCREEN W PCR REFLEX
C Diff antigen: NEGATIVE
C Diff interpretation: NOT DETECTED
C Diff toxin: NEGATIVE

## 2021-08-16 LAB — SARS CORONAVIRUS 2 BY RT PCR: SARS Coronavirus 2 by RT PCR: NEGATIVE

## 2021-08-16 LAB — LIPASE, BLOOD: Lipase: 28 U/L (ref 11–51)

## 2021-08-16 MED ORDER — SODIUM CHLORIDE 0.9 % IV BOLUS
1000.0000 mL | Freq: Once | INTRAVENOUS | Status: AC
  Administered 2021-08-16: 1000 mL via INTRAVENOUS

## 2021-08-16 MED ORDER — IOHEXOL 300 MG/ML  SOLN
100.0000 mL | Freq: Once | INTRAMUSCULAR | Status: AC | PRN
  Administered 2021-08-16: 100 mL via INTRAVENOUS

## 2021-08-16 MED ORDER — OXYCODONE-ACETAMINOPHEN 5-325 MG PO TABS
2.0000 | ORAL_TABLET | Freq: Once | ORAL | Status: AC
  Administered 2021-08-16: 2 via ORAL
  Filled 2021-08-16: qty 2

## 2021-08-16 MED ORDER — AMOXICILLIN-POT CLAVULANATE 875-125 MG PO TABS: 1.0000 | ORAL_TABLET | Freq: Two times a day (BID) | ORAL | 0 refills | Status: DC

## 2021-08-16 MED ORDER — ONDANSETRON HCL 4 MG/2ML IJ SOLN
4.0000 mg | Freq: Once | INTRAMUSCULAR | Status: AC
  Administered 2021-08-16: 4 mg via INTRAVENOUS
  Filled 2021-08-16: qty 2

## 2021-08-16 MED ORDER — OXYCODONE-ACETAMINOPHEN 5-325 MG PO TABS: 1.0000 | ORAL_TABLET | ORAL | 0 refills | Status: AC | PRN

## 2021-08-16 MED ORDER — FENTANYL CITRATE PF 50 MCG/ML IJ SOSY
50.0000 ug | PREFILLED_SYRINGE | Freq: Once | INTRAMUSCULAR | Status: AC
  Administered 2021-08-16: 50 ug via INTRAVENOUS
  Filled 2021-08-16: qty 1

## 2021-08-16 MED ORDER — NICOTINE 21 MG/24HR TD PT24
21.0000 mg | MEDICATED_PATCH | Freq: Once | TRANSDERMAL | Status: DC
  Administered 2021-08-16: 21 mg via TRANSDERMAL
  Filled 2021-08-16: qty 1

## 2021-08-16 MED ORDER — MORPHINE SULFATE (PF) 4 MG/ML IV SOLN
4.0000 mg | Freq: Once | INTRAVENOUS | Status: DC
  Filled 2021-08-16: qty 1

## 2021-08-16 MED ORDER — OXYCODONE-ACETAMINOPHEN 5-325 MG PO TABS: 1.0000 | ORAL_TABLET | ORAL | 0 refills | Status: DC | PRN

## 2021-08-16 NOTE — ED Provider Notes (Signed)
[facility] Provider Note    Event Date/Time   First MD Initiated Contact with Patient 08/16/21 1440     (approximate)   History   Abdominal Pain   HPI  [name] [name] is a 81 y.o. male with a history of cyst being removed on Thursday with Dr. Lysle Pearl who comes in with abdominal pain.  Patient reports having surgery on Thursday.  He reports doing well yesterday.  Patient states that he started having some diarrhea multiple episodes as well as some nausea with some abdominal discomfort.  He denies any fevers.  He still has a drain in place.  Physical Exam   Triage Vital Signs: ED Triage Vitals [08/16/21 1422]  Enc Vitals Group     BP (!) 144/108     Pulse Rate 75     Resp 16     Temp 97.9 F (36.6 C)     Temp Source Oral     SpO2 95 %     Weight 166 lb (75.3 kg)     Height 6' (1.829 m)     Head Circumference      Peak Flow      Pain Score 8     Pain Loc      Pain Edu?      Excl. in [place]?     Most recent vital signs: Vitals:   08/16/21 1422  BP: (!) 144/108  Pulse: 75  Resp: 16  Temp: 97.9 F (36.6 C)  SpO2: 95%     General: Awake, no distress.  CV:  Good peripheral perfusion.  Resp:  Normal effort.  Abd:  No distention.  Other:  Patient has drain in place with dressing noted over the left thigh area without any obvious redness or erythema.   ED Results / Procedures / Treatments   Labs (all labs ordered are listed, but only abnormal results are displayed) Labs Reviewed  CBC - Abnormal; Notable for the following components:      Result Value   WBC 12.9 (*)    All other components within normal limits  C DIFFICILE QUICK SCREEN W PCR REFLEX    GASTROINTESTINAL PANEL BY PCR, STOOL (REPLACES STOOL CULTURE)  SARS CORONAVIRUS 2 BY RT PCR  LIPASE, BLOOD  COMPREHENSIVE METABOLIC PANEL  URINALYSIS, ROUTINE W REFLEX MICROSCOPIC     RADIOLOGY CT pending   PROCEDURES: No Critical Care performed: No  Procedures   MEDICATIONS  ORDERED IN ED: Medications  sodium chloride 0.9 % bolus 1,000 mL (has no administration in time range)  ondansetron (ZOFRAN) injection 4 mg (has no administration in time range)  fentaNYL (SUBLIMAZE) injection 50 mcg (has no administration in time range)     IMPRESSION / MDM / ASSESSMENT AND PLAN / ED COURSE  I reviewed the triage vital signs and the nursing notes.   Patient's presentation is most consistent with acute presentation with potential threat to life or bodily function.   Differential includes diverticulitis, abscess, perforation, other acute pathology.  Will get CT imaging to further evaluate labs evaluate for Electra abnormalities, AKI.  We will give 1 dose of IV fentanyl IV Zofran IV fluids to help with symptoms.  Patient be handed off to oncoming team pending result.  Given the copious amount of stool studies in the setting of recent surgery will send stool studies and C. Difficile  CBC shows slightly elevated white count of 12.9.  Patient does report distention a course of doxycycline.  His creatinine is slightly  elevated so getting some fluids.  Patient be handed off to oncoming team pending CT imaging and further reassessment       FINAL CLINICAL IMPRESSION(S) / ED DIAGNOSES   Final diagnoses:  Diarrhea, unspecified type  Abdominal pain, unspecified abdominal location     Rx / DC Orders   ED Discharge Orders     None        Note:  This document was prepared using Dragon voice recognition software and may include unintentional dictation errors.   Vanessa Lawton, MD 08/16/21 1510

## 2021-08-16 NOTE — ED Triage Notes (Signed)
Pt via POV from home. Pt c/o intermittent nausea and generalized abd pain since last night. States that last night it started. Pt had a cyst removed on Thursday from his L flank and still has drain attached. Pt is A&OX4 and NAD. Ambulatory to triage.

## 2021-08-16 NOTE — ED Notes (Signed)
Pt A&O, IV removed, pt given discharge instructions, pt ambulating with steady gait. 

## 2021-08-16 NOTE — ED Provider Notes (Signed)
-----------------------------------------   7:30 PM on 08/16/2021 ----------------------------------------- Patient care assumed from Dr. Jari Pigg.  Patient CT scan has resulted showing concern for enteritis and possible colitis or diverticulitis.  Patient states he had the exact same thing 6 months ago that seem to resolve after antibiotics and pain medication.  Patient states he does well on Percocet for pain control.  We will prescribe a short course of pain medication for the patient as well as Augmentin.  Patient will follow-up with his doctor.  C. difficile test is negative.   Harvest Dark, MD 08/16/21 1931

## 2021-08-16 NOTE — Discharge Instructions (Addendum)
Please take your antibiotics as prescribed for the entire course.  Please follow-up with your doctor for recheck/reevaluation.  Also we recommend you follow-up with GI medicine by calling the number provided to arrange for a colonoscopy given the inflammation of the colon to rule out any cancerous causes although this very well could be due to infection.

## 2021-08-17 ENCOUNTER — Encounter: Payer: Self-pay | Admitting: Surgery

## 2021-08-24 NOTE — Anesthesia Postprocedure Evaluation (Signed)
Anesthesia Post Note  Patient: [name] [name]  Procedure(s) Performed: INCISION AND DRAINAGE ABSCESS (Buttocks)  Patient location during evaluation: PACU Anesthesia Type: General Level of consciousness: awake and alert Pain management: pain level controlled Vital Signs Assessment: post-procedure vital signs reviewed and stable Respiratory status: spontaneous breathing, nonlabored ventilation, respiratory function stable and patient connected to nasal cannula oxygen Cardiovascular status: blood pressure returned to baseline and stable Postop Assessment: no apparent nausea or vomiting Anesthetic complications: no   No notable events documented.   Last Vitals:  Vitals:   08/13/21 1346 08/13/21 1412  BP: (!) 170/75 (!) 181/79  Pulse: 67 65  Resp: 16 18  Temp: 36.6 C 36.7 C  SpO2: 99% 100%    Last Pain:  Vitals:   08/14/21 0847  TempSrc:   PainSc: [place]

## 2021-11-15 ENCOUNTER — Other Ambulatory Visit: Payer: Self-pay

## 2021-11-15 ENCOUNTER — Emergency Department
Admission: EM | Admit: 2021-11-15 | Discharge: 2021-11-15 | Disposition: A | Discharge status: Home / Self Care | Payer: Medicare Other | Attending: Emergency Medicine | Admitting: Emergency Medicine

## 2021-11-15 ENCOUNTER — Emergency Department: Payer: Medicare Other

## 2021-11-15 DIAGNOSIS — I10 Essential (primary) hypertension: Secondary | ICD-10-CM | POA: Diagnosis not present

## 2021-11-15 DIAGNOSIS — R197 Diarrhea, unspecified: Secondary | ICD-10-CM | POA: Diagnosis not present

## 2021-11-15 DIAGNOSIS — R1084 Generalized abdominal pain: Secondary | ICD-10-CM | POA: Diagnosis not present

## 2021-11-15 DIAGNOSIS — I2510 Atherosclerotic heart disease of native coronary artery without angina pectoris: Secondary | ICD-10-CM | POA: Insufficient documentation

## 2021-11-15 DIAGNOSIS — Z951 Presence of aortocoronary bypass graft: Secondary | ICD-10-CM | POA: Diagnosis not present

## 2021-11-15 DIAGNOSIS — K529 Noninfective gastroenteritis and colitis, unspecified: Secondary | ICD-10-CM

## 2021-11-15 LAB — URINALYSIS, ROUTINE W REFLEX MICROSCOPIC
Bilirubin Urine: NEGATIVE
Glucose, UA: NEGATIVE mg/dL
Hgb urine dipstick: NEGATIVE
Ketones, ur: NEGATIVE mg/dL
Leukocytes,Ua: NEGATIVE
Nitrite: NEGATIVE
Protein, ur: NEGATIVE mg/dL
Specific Gravity, Urine: 1.026 (ref 1.005–1.030)
pH: 5 (ref 5.0–8.0)

## 2021-11-15 LAB — COMPREHENSIVE METABOLIC PANEL
ALT: 12 U/L (ref 0–44)
AST: 15 U/L (ref 15–41)
Albumin: 4.1 g/dL (ref 3.5–5.0)
Alkaline Phosphatase: 67 U/L (ref 38–126)
Anion gap: 9 (ref 5–15)
BUN: 38 mg/dL — ABNORMAL HIGH (ref 8–23)
CO2: 21 mmol/L — ABNORMAL LOW (ref 22–32)
Calcium: 9.9 mg/dL (ref 8.9–10.3)
Chloride: 104 mmol/L (ref 98–111)
Creatinine, Ser: 1.6 mg/dL — ABNORMAL HIGH (ref 0.61–1.24)
GFR, Estimated: 43 mL/min — ABNORMAL LOW (ref >60)
Glucose, Bld: 110 mg/dL — ABNORMAL HIGH (ref 70–99)
Potassium: 4 mmol/L (ref 3.5–5.1)
Sodium: 134 mmol/L — ABNORMAL LOW (ref 135–145)
Total Bilirubin: 0.7 mg/dL (ref 0.3–1.2)
Total Protein: 6.9 g/dL (ref 6.5–8.1)

## 2021-11-15 LAB — CBC WITH DIFFERENTIAL/PLATELET
Abs Immature Granulocytes: 0.03 10*3/uL (ref 0.00–0.07)
Basophils Absolute: 0 10*3/uL (ref 0.0–0.1)
Basophils Relative: 0 %
Eosinophils Absolute: 0.3 10*3/uL (ref 0.0–0.5)
Eosinophils Relative: 3 %
HCT: 40.8 % (ref 39.0–52.0)
Hemoglobin: 13.6 g/dL (ref 13.0–17.0)
Immature Granulocytes: 0 %
Lymphocytes Relative: 23 %
Lymphs Abs: 2.1 10*3/uL (ref 0.7–4.0)
MCH: 29.6 pg (ref 26.0–34.0)
MCHC: 33.3 g/dL (ref 30.0–36.0)
MCV: 88.7 fL (ref 80.0–100.0)
Monocytes Absolute: 0.6 10*3/uL (ref 0.1–1.0)
Monocytes Relative: 6 %
Neutro Abs: 6.1 10*3/uL (ref 1.7–7.7)
Neutrophils Relative %: 68 %
Platelets: 242 10*3/uL (ref 150–400)
RBC: 4.6 MIL/uL (ref 4.22–5.81)
RDW: 13.3 % (ref 11.5–15.5)
WBC: 9 10*3/uL (ref 4.0–10.5)
nRBC: 0 % (ref 0.0–0.2)

## 2021-11-15 LAB — LACTIC ACID, PLASMA
Lactic Acid, Venous: 1.4 mmol/L (ref 0.5–1.9)
Lactic Acid, Venous: 1.1 mmol/L (ref 0.5–1.9)

## 2021-11-15 LAB — LIPASE, BLOOD: Lipase: 28 U/L (ref 11–51)

## 2021-11-15 MED ORDER — IOHEXOL 300 MG/ML  SOLN
80.0000 mL | Freq: Once | INTRAMUSCULAR | Status: AC | PRN
  Administered 2021-11-15: 80 mL via INTRAVENOUS

## 2021-11-15 MED ORDER — ONDANSETRON HCL 4 MG/2ML IJ SOLN
4.0000 mg | Freq: Once | INTRAMUSCULAR | Status: AC
  Administered 2021-11-15: 4 mg via INTRAVENOUS
  Filled 2021-11-15: qty 2

## 2021-11-15 MED ORDER — AMOXICILLIN-POT CLAVULANATE 875-125 MG PO TABS
1.0000 | ORAL_TABLET | Freq: Once | ORAL | Status: AC
  Administered 2021-11-15: 1 via ORAL
  Filled 2021-11-15: qty 1

## 2021-11-15 MED ORDER — ONDANSETRON 4 MG PO TBDP
4.0000 mg | ORAL_TABLET | Freq: Three times a day (TID) | ORAL | 0 refills | Status: AC | PRN
Start: 2021-11-15 — End: ?

## 2021-11-15 MED ORDER — OXYCODONE HCL 5 MG PO TABS
10.0000 mg | ORAL_TABLET | Freq: Once | ORAL | Status: AC
  Administered 2021-11-15: 10 mg via ORAL
  Filled 2021-11-15: qty 2

## 2021-11-15 MED ORDER — LACTATED RINGERS IV BOLUS
1000.0000 mL | Freq: Once | INTRAVENOUS | Status: AC
  Administered 2021-11-15: 1000 mL via INTRAVENOUS

## 2021-11-15 MED ORDER — AMOXICILLIN-POT CLAVULANATE 875-125 MG PO TABS: 1.0000 | ORAL_TABLET | Freq: Two times a day (BID) | ORAL | 0 refills | Status: AC

## 2021-11-15 MED ORDER — MORPHINE SULFATE (PF) 4 MG/ML IV SOLN
4.0000 mg | Freq: Once | INTRAVENOUS | Status: AC
  Administered 2021-11-15: 4 mg via INTRAVENOUS
  Filled 2021-11-15: qty 1

## 2021-11-15 NOTE — Discharge Instructions (Addendum)
Take the Augmentin as prescribed and finish the full course.  You may use the Zofran as needed for nausea.  Use your normal pain medication.  Return to the ER for new, worsening, or persistent severe abdominal pain, vomiting, blood in the stool, fever, weakness, or any other new or worsening symptoms that concern you.

## 2021-11-15 NOTE — ED Notes (Signed)
Pt ambulatory to the restroom at this time without assistance.

## 2021-11-15 NOTE — ED Triage Notes (Signed)
Pt arrives with c/o ABD pain that started 2 days ago. Pt endorse nausea and generalized weakness. Per pt, he has not been eating the past couple of days. Pt has hx of diverticulitis.

## 2021-11-15 NOTE — ED Provider Notes (Signed)
-----------------------------------------   6:20 PM on 11/15/2021 -----------------------------------------  I took over care of this patient from Dr. Charna Archer.  CT shows nonspecific colitis similar to the patient's presentation from 6/11, the patient states his symptoms are similar.  He was successfully treated with Augmentin at that time.  Per the CT read, ischemic colitis is on the differential for the CT findings.  The patient has no clinical evidence for ischemic colitis and has no prior history of this.  A lactate was sent and is normal.  Currently the patient reports significant improved pain and appears well.  He is stable for discharge home.  I counseled him on the results of the work-up and plan of care.  I gave him strict return precautions and he expresses understanding.   Arta Silence, MD 11/15/21 1821

## 2021-11-15 NOTE — ED Notes (Signed)
Pt transported to CT at this time.

## 2021-11-15 NOTE — ED Provider Notes (Signed)
[facility] Provider Note    Event Date/Time   First MD Initiated Contact with Patient 11/15/21 1401     (approximate)   History   Chief Complaint Abdominal Pain   HPI  [name] [name] is a 81 y.o. male with past medical history of hypertension, CAD status post CABG, AAA who presents to the ED complaining of abdominal pain.  Patient reports that he has been dealing with increasing pain in his abdomen diffusely for the past 2 days.  He describes the pain as constant and dull with intermittent "spasms" of severe pain about every 20 to 30 minutes.  He describes the pain as similar to prior episodes of diverticulitis, which he states he has had twice this year.  He denies any associated fevers, has been feeling nauseous without any vomiting.  He endorses frequent watery diarrhea but has not noticed any blood in his stool.  He denies any dysuria, hematuria, or flank pain.     Physical Exam   Triage Vital Signs: ED Triage Vitals  Enc Vitals Group     BP      Pulse      Resp      Temp      Temp src      SpO2      Weight      Height      Head Circumference      Peak Flow      Pain Score      Pain Loc      Pain Edu?      Excl. in [place]?     Most recent vital signs: Vitals:   11/15/21 1413  BP: 115/71  Pulse: 68  Resp: 18  Temp: 97.7 F (36.5 C)  SpO2: 100%    Constitutional: Alert and oriented. Eyes: Conjunctivae are normal. Head: Atraumatic. Nose: No congestion/rhinnorhea. Mouth/Throat: Mucous membranes are moist.  Cardiovascular: Normal rate, regular rhythm. Grossly normal heart sounds.  2+ radial pulses bilaterally. Respiratory: Normal respiratory effort.  No retractions. Lungs CTAB. Gastrointestinal: Soft and tender to palpation diffusely with no rebound or guarding, greatest in the right upper quadrant. No distention. Musculoskeletal: No lower extremity tenderness nor edema.  Neurologic:  Normal speech and language. No gross focal  neurologic deficits are appreciated.    ED Results / Procedures / Treatments   Labs (all labs ordered are listed, but only abnormal results are displayed) Labs Reviewed  COMPREHENSIVE METABOLIC PANEL - Abnormal; Notable for the following components:      Result Value   Sodium 134 (*)    CO2 21 (*)    Glucose, Bld 110 (*)    BUN 38 (*)    Creatinine, Ser 1.60 (*)    GFR, Estimated 43 (*)    All other components within normal limits  CBC WITH DIFFERENTIAL/PLATELET  LIPASE, BLOOD  URINALYSIS, ROUTINE W REFLEX MICROSCOPIC     EKG  ED ECG REPORT I, Blake Divine, the attending physician, personally viewed and interpreted this ECG.   Date: 11/15/2021  EKG Time: 14:10  Rate: 64  Rhythm: normal sinus rhythm  Axis: Normal  Intervals:right bundle branch block  ST&T Change: None  PROCEDURES:  Critical Care performed: No  Procedures   MEDICATIONS ORDERED IN ED: Medications  morphine (PF) 4 MG/ML injection 4 mg (4 mg Intravenous Given 11/15/21 1455)  ondansetron (ZOFRAN) injection 4 mg (4 mg Intravenous Given 11/15/21 1455)  lactated ringers bolus 1,000 mL (1,000 mLs Intravenous New Bag/Given 11/15/21 1455)  IMPRESSION / MDM / ASSESSMENT AND PLAN / ED COURSE  I reviewed the triage vital signs and the nursing notes.                              81 y.o. male with past medical history of hypertension, CAD status post CABG, and AAA who presents to the ED with  Patient's presentation is most consistent with acute presentation with potential threat to life or bodily function.  Differential diagnosis includes, but is not limited to, diverticulitis, bowel obstruction, intra-abdominal abscess, UTI, kidney stone, colitis.  Patient nontoxic-appearing and in no acute distress, vital signs are unremarkable.  He is diffusely tender to his abdomen on exam, greatest in the right upper quadrant despite patient saying symptoms are similar to prior diverticulitis.  We will further  assess with CT scan, labs thus far are reassuring with chronic kidney disease similar to previous, no significant electrolyte abnormality.  Patient also without significant anemia or leukocytosis, urinalysis is pending.  We will treat symptomatically with IV morphine and Zofran, plan for reassessment following CT imaging.  Patient turned over to oncoming provider pending CT results and reassessment.      FINAL CLINICAL IMPRESSION(S) / ED DIAGNOSES   Final diagnoses:  Generalized abdominal pain     Rx / DC Orders   ED Discharge Orders     None        Note:  This document was prepared using Dragon voice recognition software and may include unintentional dictation errors.   Blake Divine, MD 11/15/21 1517

## 2022-04-30 IMAGING — CT CT CHEST W/ CM
2 of 4 series · 15 of 36 positions shown, 18 images · IV contrast (omnipaque)
Comparison: CT 02/15/2021

CLINICAL DATA: Central chest mass

EXAM:
CT CHEST WITH CONTRAST
TECHNIQUE: Multidetector CT imaging of the chest was performed during
intravenous contrast administration.
CONTRAST:  60mL OMNIPAQUE IOHEXOL 350 MG/ML SOLN

[Series 2: axial st · axial · 0.76mm/px · z∈[-597,-289]mm · 12 of 182 slices shown, 15 images]
[im 14/182  mediastinal]
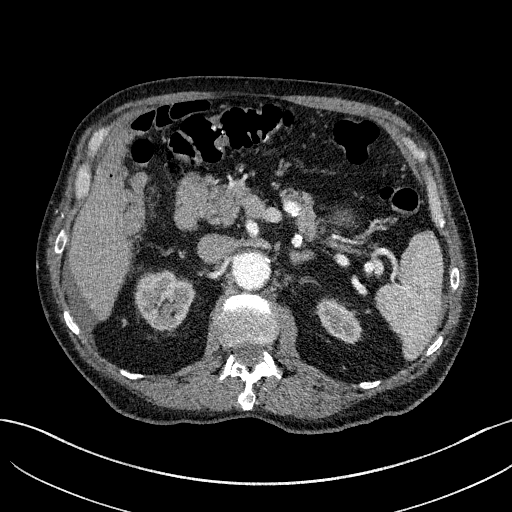
[im 14/182  lung]
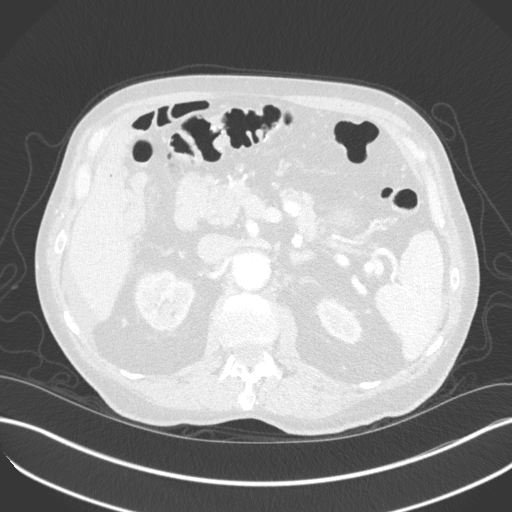
[im 28/182  lung]
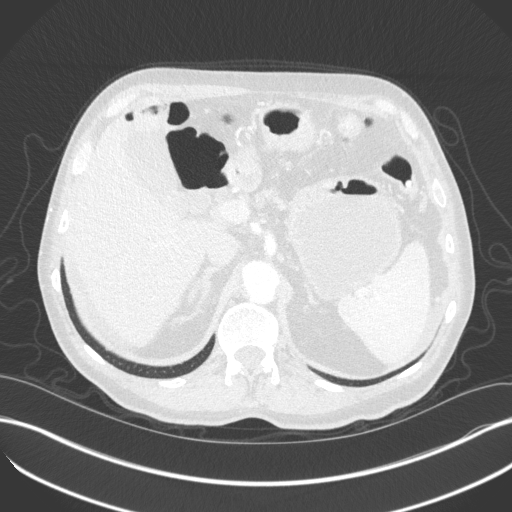
[im 42/182  lung]
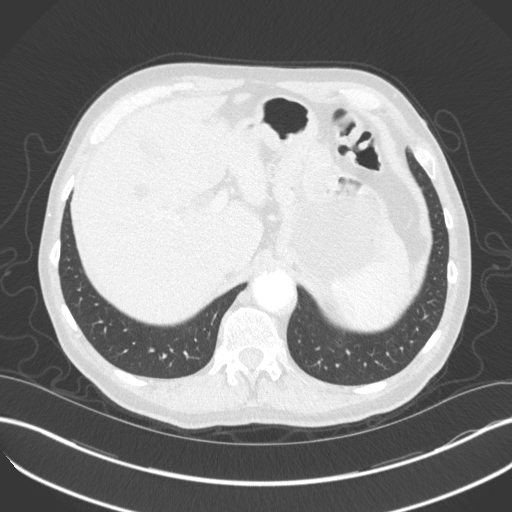
[im 56/182  lung]
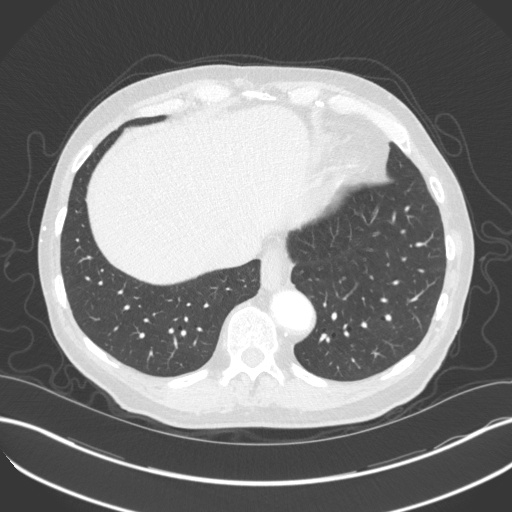
[im 70/182  mediastinal]
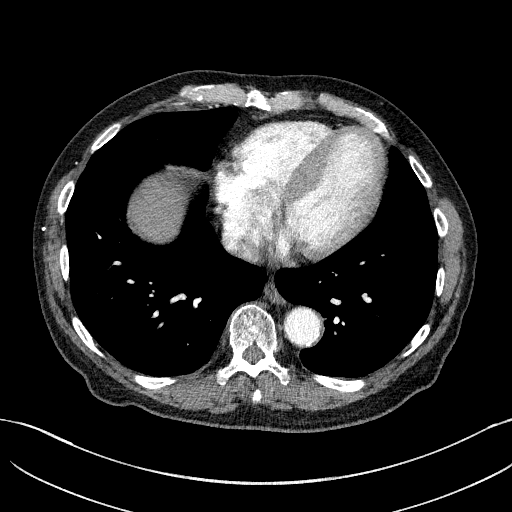
[im 70/182  lung]
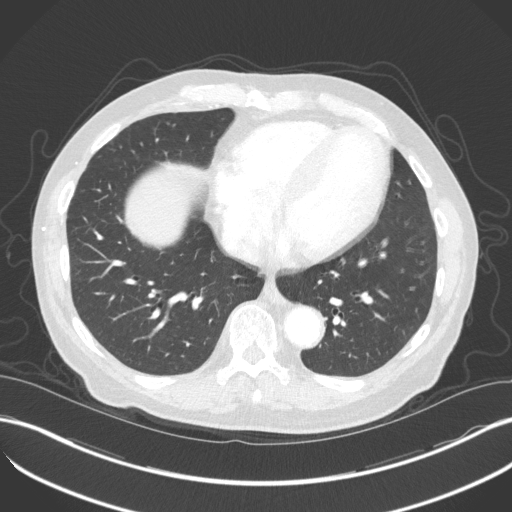
[im 84/182  lung]
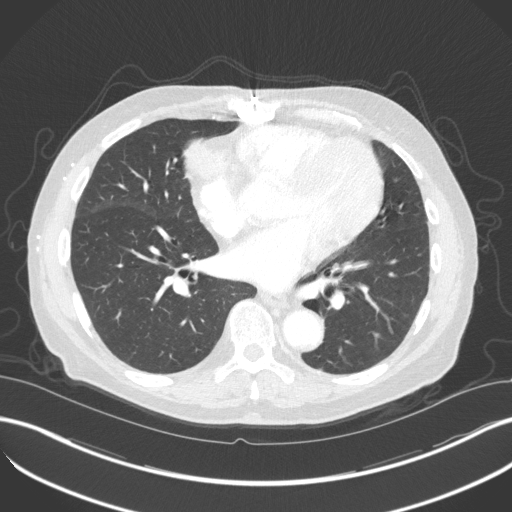
[im 98/182  lung]
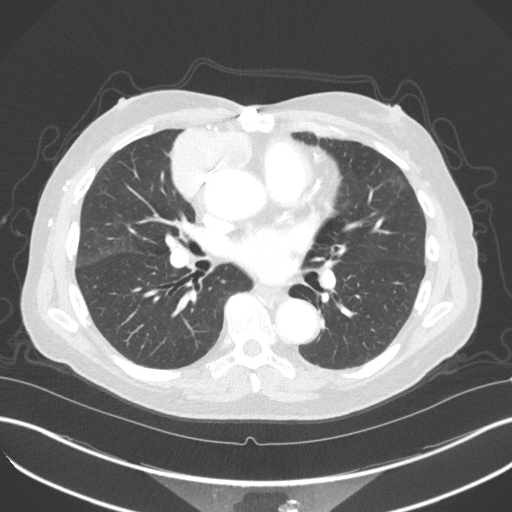
[im 112/182  lung]
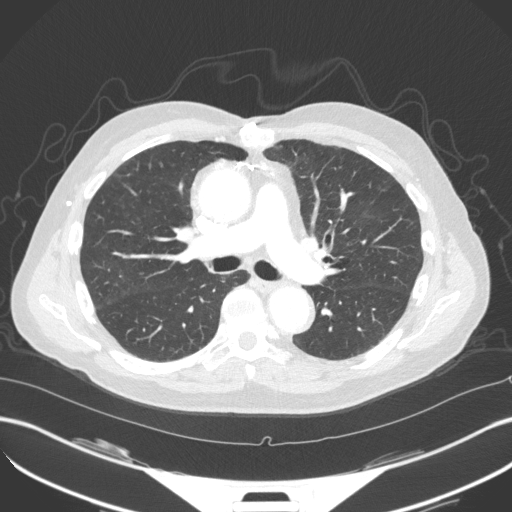
[im 126/182  mediastinal]
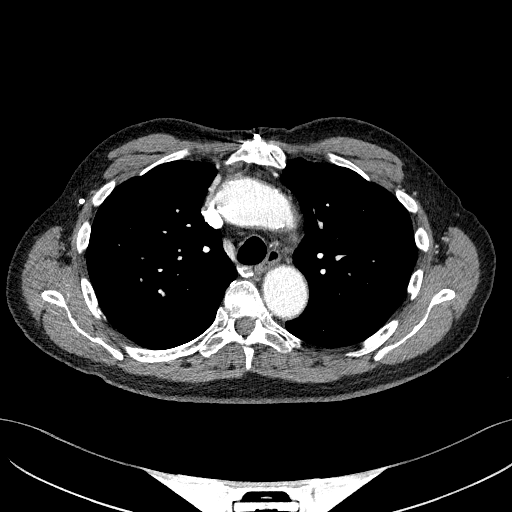
[im 126/182  lung]
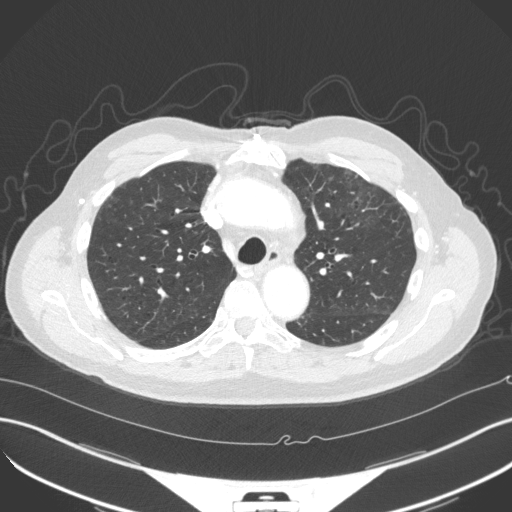
[im 140/182  lung]
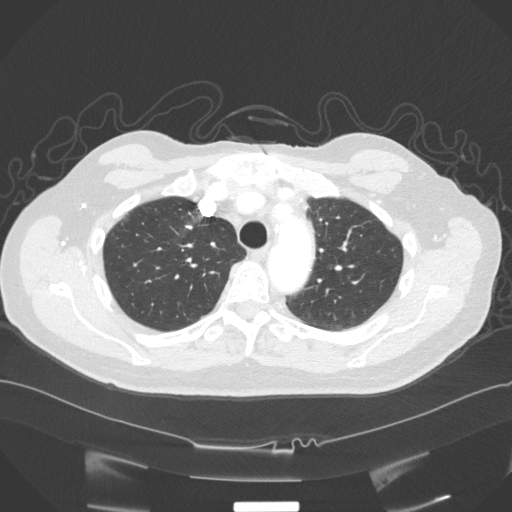
[im 154/182  lung]
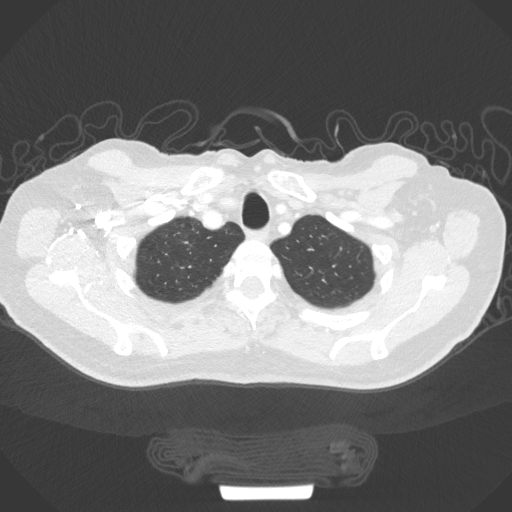
[im 168/182  lung]
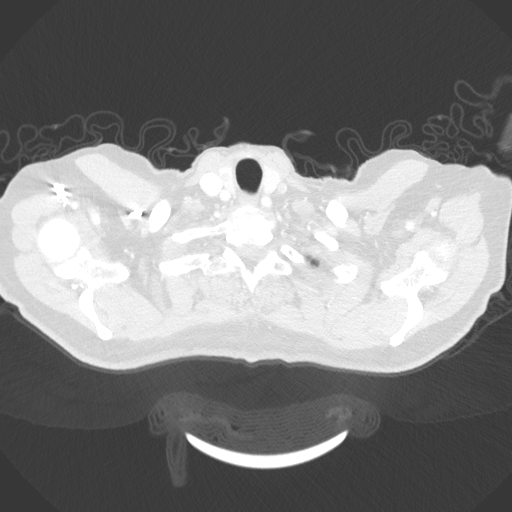

[Series 5: coronal · coronal · 0.71mm/px · 3 of 142 slices shown]
[im 29/142  lung]
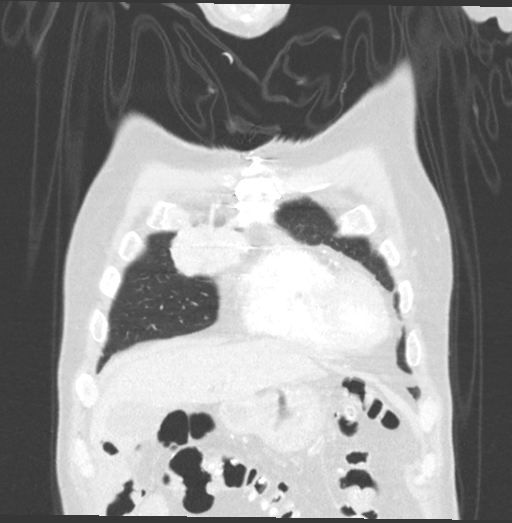
[im 57/142  lung]
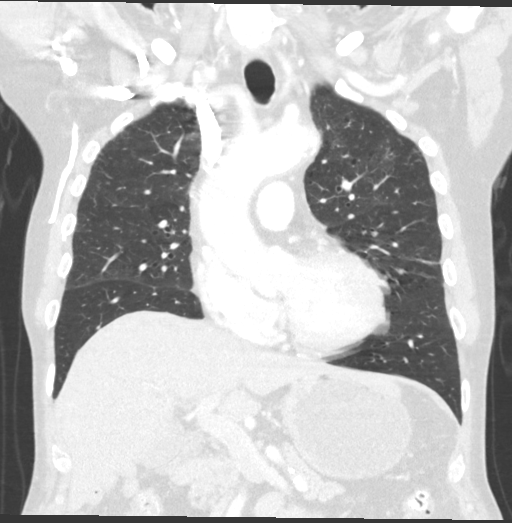
[im 85/142  lung]
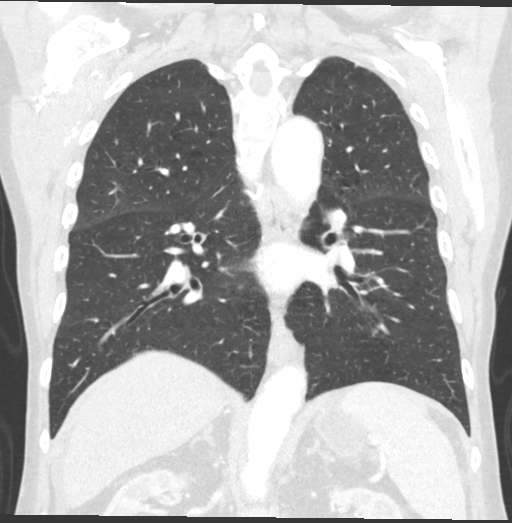

[15 of 36 positions shown; findings below may reference images not displayed]

FINDINGS: Cardiovascular: Post CABG changes. Tortuous thoracic aortic arch
with moderate aortic atherosclerosis. Mild aneurysmal dilatation of
the ascending aorta up to 4 cm. No dissection is seen. Common origin
of the left common carotid and right brachiocephalic vessels.
Borderline cardiomegaly. No pericardial effusion.

Mediastinum/Nodes: Midline trachea. No thyroid mass. Mostly solid
anterior mediastinal mass measuring 6.4 by 4.7 by 5.6 cm anterior to
the aortic root and ascending aorta. Mass appears to contain small
cystic spaces. Esophagus within normal limits. No suspicious lymph
nodes.

Lungs/Pleura: No acute consolidation, pleural effusion, or
pneumothorax. Mild subpleural reticulation consistent with scarring.
Punctate 2-3 mm peripheral right upper lobe lung nodules, series 3,
image 59 and punctate subpleural right lower lobe lung nodule,
series 3, image 97.

Upper Abdomen: Cyst within the liver. Exophytic cyst at the
anterolateral spleen. Small amount of free fluid in the upper
quadrants.

Musculoskeletal: Post sternotomy changes. No acute osseous
abnormality.
IMPRESSION: 1. 6.4 x 4.7 x 5.6 cm indeterminate solid mass within the anterior
mediastinum; mass could represent thymoma, thymic carcinoma, or
potential lymphoma. Cardiothoracic surgical consultation is
recommended.
2. Post CABG changes. Ascending aortic diameter up to 4 cm.
Recommend annual imaging followup by CTA or MRA. This recommendation
follows 0090 ACCF/AHA/AATS/ACR/ASA/SCA/NYA/REBH/IRIZARRY/JNARTEY Guidelines
for the Diagnosis and Management of Patients with Thoracic Aortic
Disease. Circulation. 0090; 121: E266-e369. Aortic aneurysm NOS
(W0DM5-DMY.R)
3. Punctate pulmonary nodules measuring up to 3 mm. No follow-up
needed if patient is low-risk (and has no known or suspected primary
neoplasm). Non-contrast chest CT can be considered in 12 months if
patient is high-risk. This recommendation follows the consensus
statement: Guidelines for Management of Incidental Pulmonary Nodules
Detected on CT Images: From the [HOSPITAL] 6281; Radiology
6281; [DATE].
4. Trace free fluid in the bilateral upper quadrants

Aortic aneurysm NOS (W0DM5-DMY.R).Aortic Atherosclerosis
(W0DM5-GAX.X).

## 2022-10-27 IMAGING — CT CT ABD-PELV W/ CM
3 of 5 series · 15 of 46 positions shown, 17 images · IV contrast (APPLIED)
Comparison: February 15, 2021

CLINICAL DATA: Recent history of cyst removal with subsequent
abdominal pain.

EXAM:
CT ABDOMEN AND PELVIS WITH CONTRAST
TECHNIQUE: Multidetector CT imaging of the abdomen and pelvis was performed
using the standard protocol following bolus administration of
intravenous contrast.

[Series 2: abdomen 5.0 · axial · 0.76mm/px · z∈[+1107,+1497]mm · 10 of 96 slices shown, 12 images]
[im 9/96  soft-tissue]
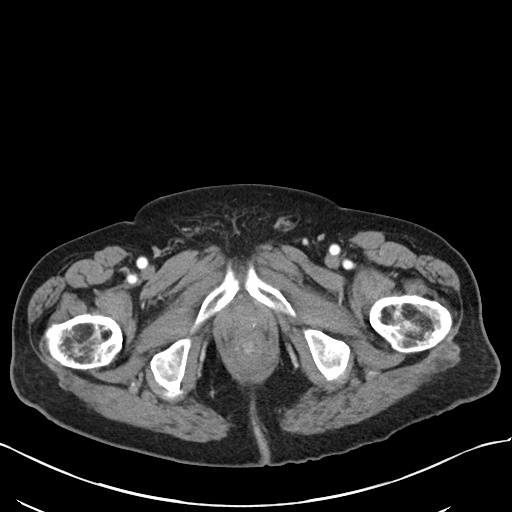
[im 9/96  bone]
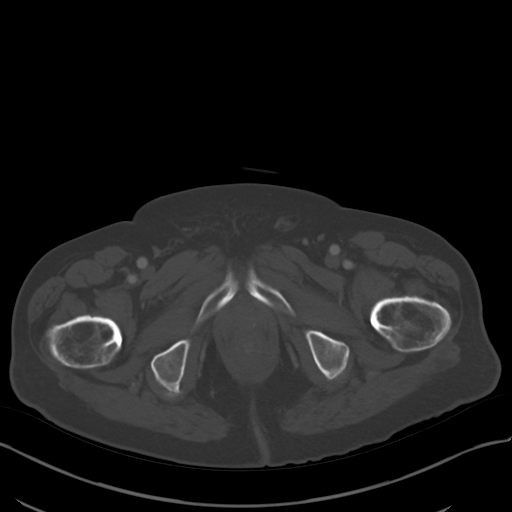
[im 18/96  soft-tissue]
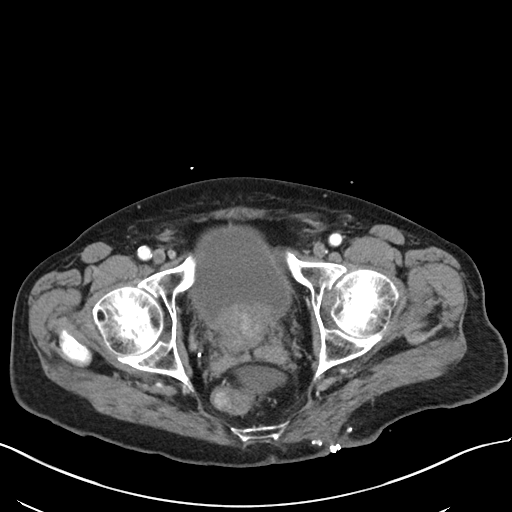
[im 26/96  soft-tissue]
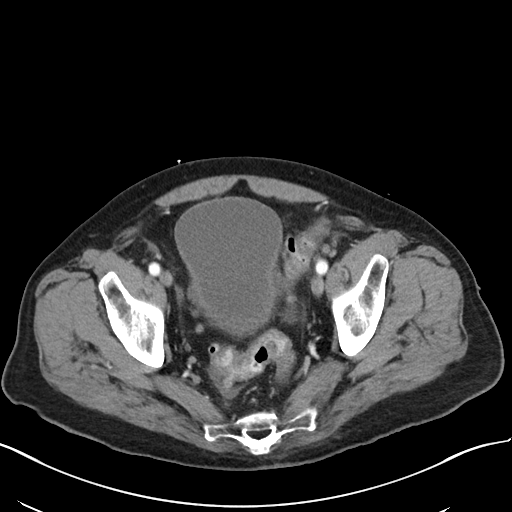
[im 35/96  soft-tissue]
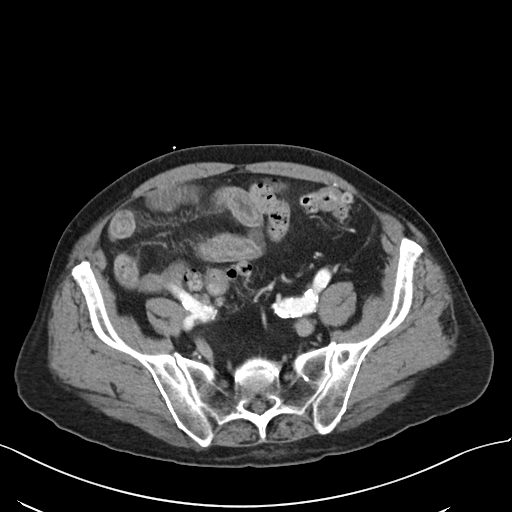
[im 44/96  soft-tissue]
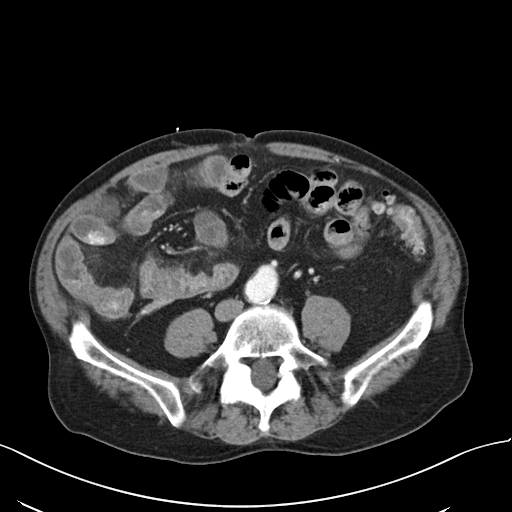
[im 52/96  soft-tissue]
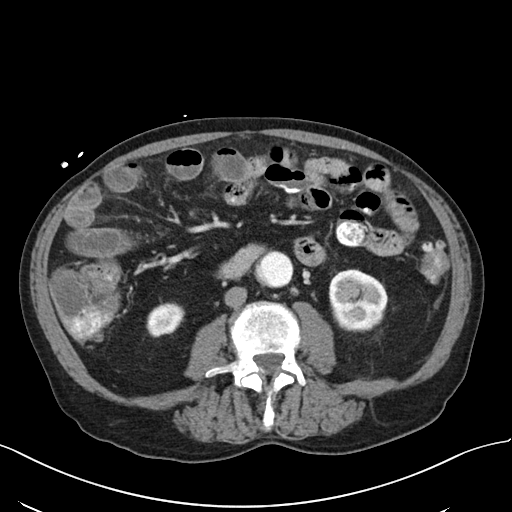
[im 61/96  soft-tissue]
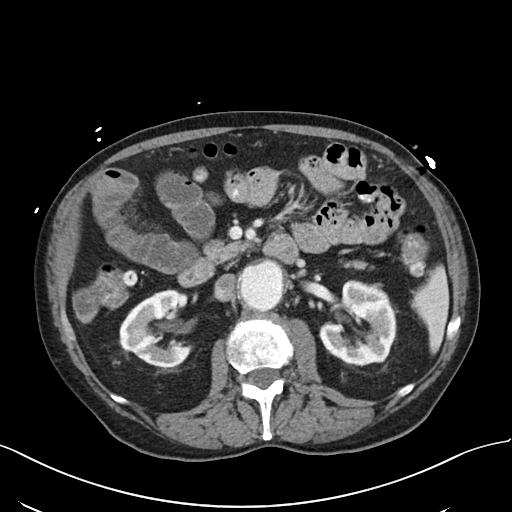
[im 70/96  soft-tissue]
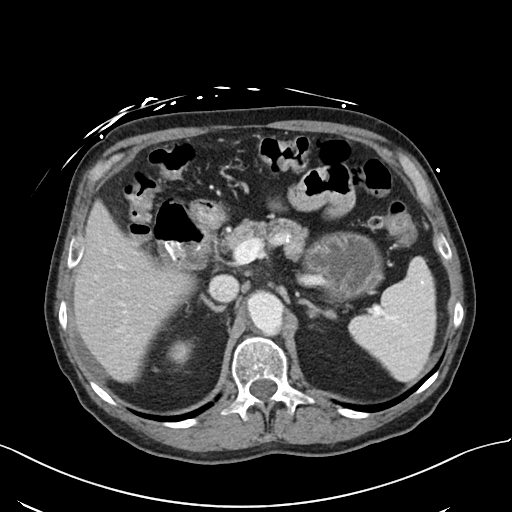
[im 78/96  soft-tissue]
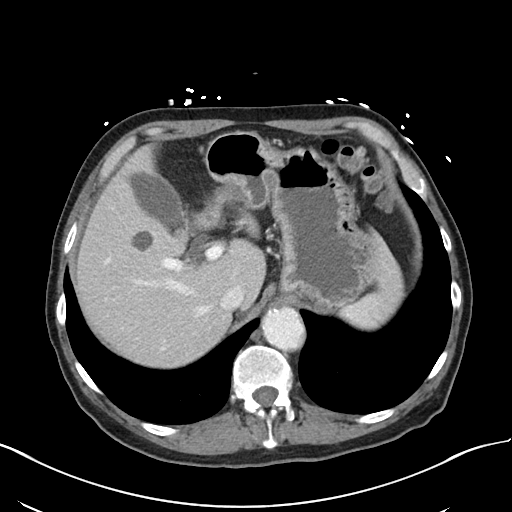
[im 78/96  bone]
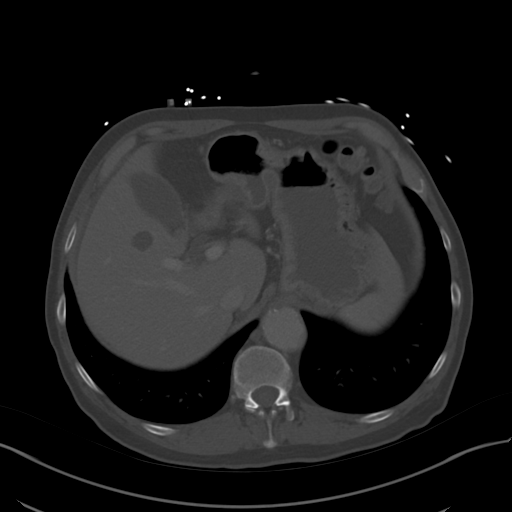
[im 87/96  soft-tissue]
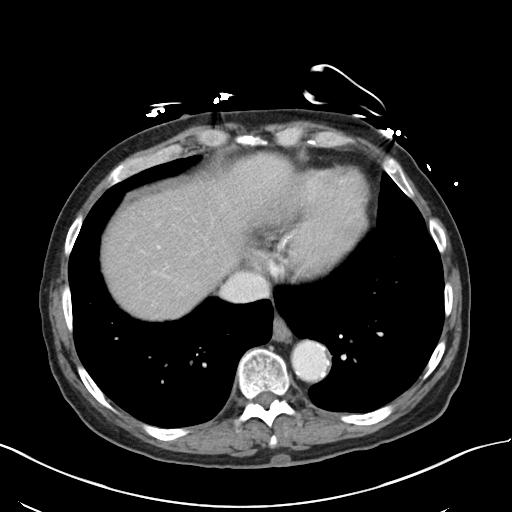

[Series 4: lung · axial · 0.76mm/px · z∈[+1398,+1416]mm · 2 of 81 slices shown]
[im 9/81  bone]
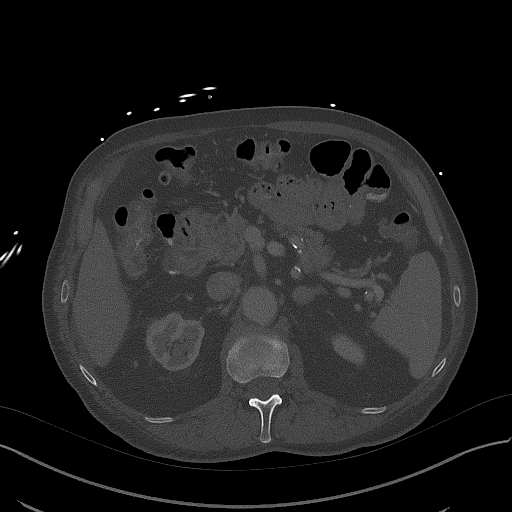
[im 18/81  bone]
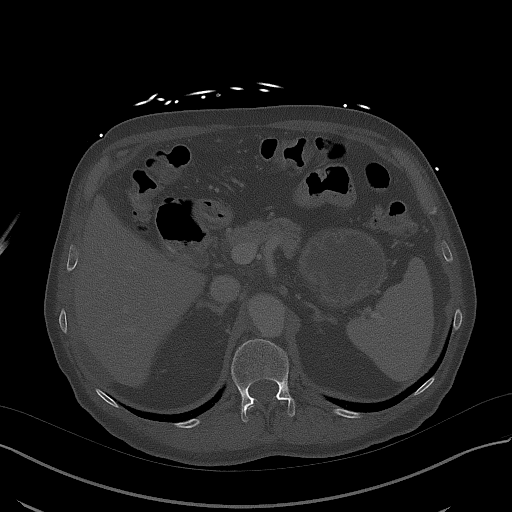

[Series 5: abdomen 3.0 mpr cor · coronal · 0.71mm/px · 3 of 92 slices shown]
[im 31/92  soft-tissue]
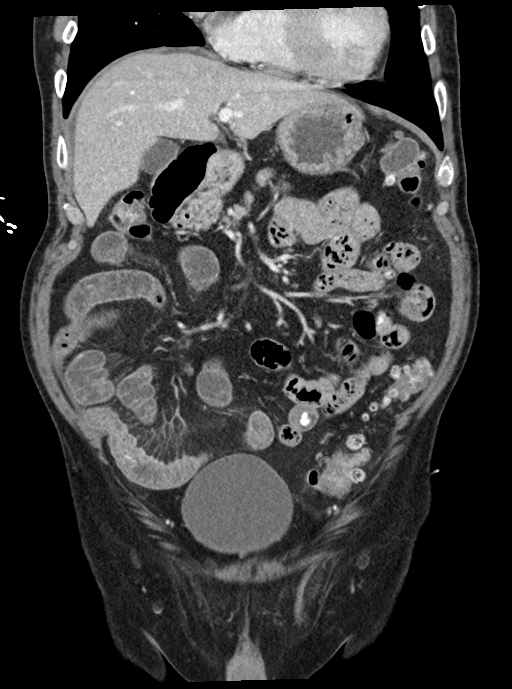
[im 41/92  soft-tissue]
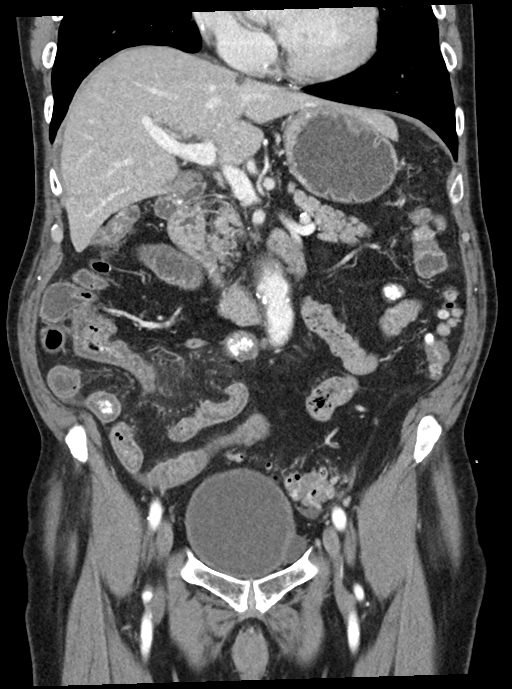
[im 51/92  soft-tissue]
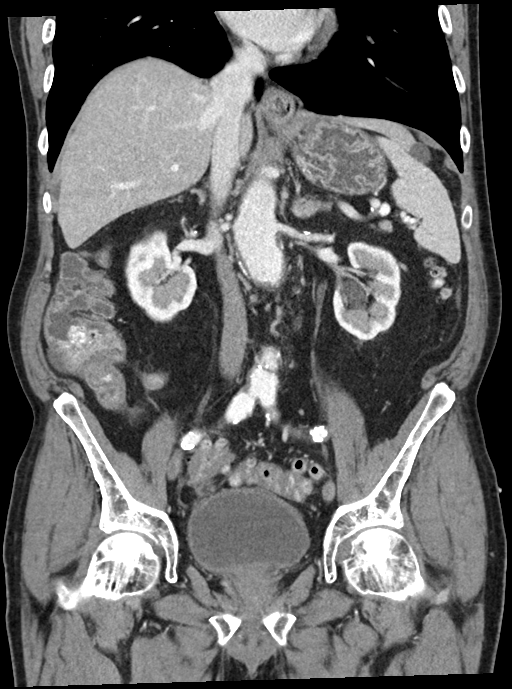

[15 of 46 positions shown; findings below may reference images not displayed]

RADIATION DOSE REDUCTION: This exam was performed according to the
departmental dose-optimization program which includes automated
exposure control, adjustment of the mA and/or kV according to
patient size and/or use of iterative reconstruction technique.

CONTRAST:  100mL OMNIPAQUE IOHEXOL 300 MG/ML  SOLN
FINDINGS: Lower chest: No acute abnormality.

Hepatobiliary: A 1.4 cm diameter cystic appearing area is seen
within the right lobe of the liver the no gallstones, gallbladder
wall thickening, or biliary dilatation.

Pancreas: Unremarkable. No pancreatic ductal dilatation or
surrounding inflammatory changes.

Spleen: A 2.0 cm x 1.5 cm exophytic cystic appearing area is seen
along the anterolateral aspect of an otherwise normal-appearing
spleen.

Adrenals/Urinary Tract: The right adrenal gland is normal in
appearance. A 15 mm x 13 mm isodense left adrenal mass is seen
(approximately 9.17 Hounsfield units). Kidneys are normal, without
renal calculi, focal lesion, or hydronephrosis. A 2.0 cm x 1.0 cm
diverticulum is seen along the lateral aspect of the urinary bladder
on the left. Mild posterior urinary bladder wall thickening is also
seen.

Stomach/Bowel: There is a small hiatal hernia. Appendix appears
normal. No evidence of bowel dilatation. Mildly thickened and
inflamed small bowel loops are seen within the mid and lower right
abdomen. Numerous diverticula are seen throughout the large bowel.
The mid portion of the sigmoid colon is also markedly thickened.

Vascular/Lymphatic: Aortic atherosclerosis with 4.3 cm x 3.6 cm
aneurysmal dilatation of the infrarenal abdominal aorta. This is
mildly increased in size when compared to the prior study (measured
approximately 4.0 cm in AP measurement on the prior exam). No
enlarged abdominal or pelvic lymph nodes.

Reproductive: Prostate gland is markedly enlarged and heterogeneous
in appearance.

Other: No abdominal wall hernia or abnormality. There is a very
small amount of posterior pelvic free fluid.

Musculoskeletal: Grade 1 anterolisthesis of the L5 vertebral body is
noted on S1.
IMPRESSION: 1. Mildly thickened and inflamed small bowel loops within the mid
and lower right abdomen, consistent with an enteritis.
2. Colonic diverticulosis with marked thickening of the mid portion
of the sigmoid colon. While this may represent sequelae associated
with an acute sigmoid diverticulitis, correlation with colonoscopy
is recommended to exclude the presence of an underlying neoplastic
process.
3. 4.3 cm x 3.6 cm infrarenal abdominal aortic aneurysm. This is
mildly increased in size when compared to the prior study (measured
approximately 4.0 cm in AP measurement on the prior exam).
4. Marked severity prostatomegaly. Recommend correlation with PSA
levels.
5. Small hiatal hernia.
6. Grade 1 anterolisthesis of the L5 vertebral body on S1.

Aortic Atherosclerosis ([id number]).
